# Patient Record
Sex: Male | Born: 1975 | Race: White | Hispanic: No | Marital: Married | Smoking: Never smoker
Health system: Southern US, Community
[De-identification: ages and names within clinical notes are randomized; demographics above are authoritative.]

## PROBLEM LIST (undated history)

## (undated) DIAGNOSIS — M722 Plantar fascial fibromatosis: Secondary | ICD-10-CM

## (undated) DIAGNOSIS — R51 Headache: Secondary | ICD-10-CM

## (undated) DIAGNOSIS — R519 Headache, unspecified: Secondary | ICD-10-CM

## (undated) DIAGNOSIS — K219 Gastro-esophageal reflux disease without esophagitis: Secondary | ICD-10-CM

## (undated) DIAGNOSIS — I10 Essential (primary) hypertension: Secondary | ICD-10-CM

## (undated) HISTORY — DX: Gastro-esophageal reflux disease without esophagitis: K21.9

## (undated) HISTORY — DX: Headache: R51

## (undated) HISTORY — DX: Plantar fascial fibromatosis: M72.2

## (undated) HISTORY — PX: CHOLECYSTECTOMY: SHX55

## (undated) HISTORY — DX: Essential (primary) hypertension: I10

## (undated) HISTORY — DX: Headache, unspecified: R51.9

---

## 2007-07-27 ENCOUNTER — Emergency Department: Payer: Self-pay | Admitting: Emergency Medicine

## 2010-08-19 ENCOUNTER — Inpatient Hospital Stay: Payer: Self-pay | Admitting: Surgery

## 2010-10-14 ENCOUNTER — Ambulatory Visit: Payer: Self-pay | Admitting: Gastroenterology

## 2012-03-27 ENCOUNTER — Emergency Department: Payer: Self-pay | Admitting: Emergency Medicine

## 2014-10-27 ENCOUNTER — Other Ambulatory Visit: Payer: Self-pay | Admitting: Family Medicine

## 2014-10-31 NOTE — Telephone Encounter (Signed)
Omeprazole refilled #30 (0) refill Appt needed.

## 2014-11-29 ENCOUNTER — Other Ambulatory Visit: Payer: Self-pay | Admitting: Family Medicine

## 2014-11-29 NOTE — Telephone Encounter (Signed)
Pt. Need a refill on  Omeprazole   [facility] Pt call back # is  [phone]

## 2014-11-29 NOTE — Telephone Encounter (Signed)
Patient aware of decline until appt.

## 2014-12-03 ENCOUNTER — Ambulatory Visit (INDEPENDENT_AMBULATORY_CARE_PROVIDER_SITE_OTHER): Payer: 59 | Admitting: Family Medicine

## 2014-12-03 ENCOUNTER — Telehealth: Payer: Self-pay

## 2014-12-03 ENCOUNTER — Encounter: Payer: Self-pay | Admitting: Family Medicine

## 2014-12-03 VITALS — BP 136/91 | HR 58 | Temp 98.3°F | Resp 16 | Ht 70.0 in | Wt 268.4 lb

## 2014-12-03 DIAGNOSIS — Z8249 Family history of ischemic heart disease and other diseases of the circulatory system: Secondary | ICD-10-CM | POA: Diagnosis not present

## 2014-12-03 DIAGNOSIS — R030 Elevated blood-pressure reading, without diagnosis of hypertension: Secondary | ICD-10-CM | POA: Diagnosis not present

## 2014-12-03 DIAGNOSIS — R739 Hyperglycemia, unspecified: Secondary | ICD-10-CM | POA: Diagnosis not present

## 2014-12-03 DIAGNOSIS — K219 Gastro-esophageal reflux disease without esophagitis: Secondary | ICD-10-CM | POA: Diagnosis not present

## 2014-12-03 DIAGNOSIS — IMO0001 Reserved for inherently not codable concepts without codable children: Secondary | ICD-10-CM

## 2014-12-03 DIAGNOSIS — Z833 Family history of diabetes mellitus: Secondary | ICD-10-CM | POA: Diagnosis not present

## 2014-12-03 DIAGNOSIS — I10 Essential (primary) hypertension: Secondary | ICD-10-CM | POA: Insufficient documentation

## 2014-12-03 DIAGNOSIS — E669 Obesity, unspecified: Secondary | ICD-10-CM | POA: Diagnosis not present

## 2014-12-03 MED ORDER — RANITIDINE HCL 150 MG PO CAPS: 150.0000 mg | ORAL_CAPSULE | Freq: Every day | ORAL | Status: DC

## 2014-12-03 NOTE — Progress Notes (Signed)
Date:  12/03/2014   Name:  Tyler StarcherBryan Marsch   DOB:  February 13, 1976   MRN:  [id number]  PCP:  Filbert BertholdAmy L Krebs, NP    Chief Complaint: Heartburn   History of Present Illness:  This is a 39 y.o. male for refill omprazole, has taken for years, gets burning in throat when lies down at night when doesn't take, has tried antacids which help a little, no hx EGD. BP borderline last visit, father with CABG in his early 7660's. Weight stable, no regular exercise. BG elevated in January, GM with DM, no lipids in system.  Review of Systems:  Review of Systems  Constitutional: Negative for fever and fatigue.  Respiratory: Negative for shortness of breath.   Cardiovascular: Negative for chest pain and leg swelling.  Endocrine: Negative for polyuria.  Genitourinary: Negative for difficulty urinating.    Patient Active Problem List   Diagnosis Date Noted  . Elevated BP 12/03/2014  . GERD (gastroesophageal reflux disease) 12/03/2014  . Obesity, Class II, BMI 35-39.9 12/03/2014  . Hyperglycemia 12/03/2014    Prior to Admission medications   Medication Sig Start Date End Date Taking? Authorizing Provider  ranitidine (ZANTAC) 150 MG capsule Take 1 capsule (150 mg total) by mouth at bedtime. 12/03/14   Schuyler AmorWilliam Wing Schoch, MD    Allergies  Allergen Reactions  . Aspirin     Past Surgical History  Procedure Laterality Date  . Cholecystectomy      Social History  Substance Use Topics  . Smoking status: Never Smoker   . Smokeless tobacco: Not on file  . Alcohol Use: No     Comment: former drinker    Family History  Problem Relation Age of Onset  . Cancer Maternal Grandmother   . Diabetes Maternal Grandmother   . Cancer Maternal Grandfather   . Diabetes Maternal Grandfather     Medication list has been reviewed and updated.  Physical Examination: BP 136/91 mmHg  Pulse 58  Temp(Src) 98.3 F (36.8 C) (Oral)  Resp 16  Ht 5\' 10"  (1.778 m)  Wt 268 lb 6.4 oz (121.745 kg)  BMI 38.51 kg/m2  Physical  Exam  Constitutional: He appears well-developed and well-nourished.  Cardiovascular: Normal rate, regular rhythm and normal heart sounds.   Pulmonary/Chest: Effort normal and breath sounds normal.  Musculoskeletal: He exhibits no edema.  Neurological: He is alert. Coordination normal.  Skin: Skin is warm and dry.  Psychiatric: He has a normal mood and affect. His behavior is normal.    Assessment and Plan:  1. Gastroesophageal reflux disease, esophagitis presence not specified Discussed potential LT se's PPI use, will try change to Zantac qhs, call if sxs recur, consider EGD - ranitidine (ZANTAC) 150 MG capsule; Take 1 capsule (150 mg total) by mouth at bedtime.  Dispense: 90 capsule; Refill: 3  2. Elevated BP Borderline for two visits, will decrease salt, try to lose weight, recheck 1 month  3. Obesity, Class II, BMI 35-39.9 Exercise/diet discussed, check lipids given FH heart dz - Lipid panel - TSH  4. Hyperglycemia With FH DM - HgB A1c  Return in about 4 weeks (around 12/31/2014).  Dionne AnoWilliam M. Kingsley SpittlePlonk, Jr. MD [facility]  12/03/2014

## 2014-12-03 NOTE — Telephone Encounter (Signed)
Pt's Rx for Ranitidine is 51dollar co pay but if we approve for tablet instead of capsule it will be free and got approval from Dr. Hollace HaywardPlonk.

## 2015-01-07 ENCOUNTER — Ambulatory Visit: Payer: Self-pay | Admitting: Family Medicine

## 2015-12-22 ENCOUNTER — Emergency Department
Admission: EM | Admit: 2015-12-22 | Discharge: 2015-12-22 | Disposition: A | Discharge status: Home / Self Care | Payer: 59 | Attending: Emergency Medicine | Admitting: Emergency Medicine

## 2015-12-22 ENCOUNTER — Encounter: Payer: Self-pay | Admitting: *Deleted

## 2015-12-22 ENCOUNTER — Emergency Department: Payer: 59

## 2015-12-22 DIAGNOSIS — Y999 Unspecified external cause status: Secondary | ICD-10-CM | POA: Diagnosis not present

## 2015-12-22 DIAGNOSIS — Y929 Unspecified place or not applicable: Secondary | ICD-10-CM | POA: Insufficient documentation

## 2015-12-22 DIAGNOSIS — S6991XA Unspecified injury of right wrist, hand and finger(s), initial encounter: Secondary | ICD-10-CM | POA: Diagnosis present

## 2015-12-22 DIAGNOSIS — Y9389 Activity, other specified: Secondary | ICD-10-CM | POA: Diagnosis not present

## 2015-12-22 DIAGNOSIS — I10 Essential (primary) hypertension: Secondary | ICD-10-CM | POA: Insufficient documentation

## 2015-12-22 DIAGNOSIS — W2201XA Walked into wall, initial encounter: Secondary | ICD-10-CM | POA: Diagnosis not present

## 2015-12-22 DIAGNOSIS — S62316A Displaced fracture of base of fifth metacarpal bone, right hand, initial encounter for closed fracture: Secondary | ICD-10-CM | POA: Diagnosis not present

## 2015-12-22 DIAGNOSIS — S62339A Displaced fracture of neck of unspecified metacarpal bone, initial encounter for closed fracture: Secondary | ICD-10-CM

## 2015-12-22 MED ORDER — IBUPROFEN 800 MG PO TABS
800.0000 mg | ORAL_TABLET | Freq: Once | ORAL | Status: AC
  Administered 2015-12-22: 800 mg via ORAL
  Filled 2015-12-22: qty 1

## 2015-12-22 MED ORDER — OXYCODONE-ACETAMINOPHEN 5-325 MG PO TABS: 1.0000 | ORAL_TABLET | Freq: Four times a day (QID) | ORAL | 0 refills | Status: DC | PRN

## 2015-12-22 MED ORDER — IBUPROFEN 800 MG PO TABS: 800.0000 mg | ORAL_TABLET | Freq: Three times a day (TID) | ORAL | 0 refills | Status: DC | PRN

## 2015-12-22 MED ORDER — OXYCODONE-ACETAMINOPHEN 5-325 MG PO TABS
1.0000 | ORAL_TABLET | Freq: Once | ORAL | Status: AC
  Administered 2015-12-22: 1 via ORAL
  Filled 2015-12-22: qty 1

## 2015-12-22 NOTE — ED Provider Notes (Signed)
[facility] Emergency Department Provider Note   ____________________________________________   None    (approximate)  I have reviewed the triage vital signs and the nursing notes.   HISTORY  Chief Complaint Hand Injury    HPI Tyler StarcherBryan Hinton is a 40 y.o. male patient complain of pain and edema to the right hand secondary to punching a wall last night. Patient rates his pain as a 4/10. Patient described a pain as "achy". Patient denies loss of sensation or function of the hand. No palliative measures for this complaint.   Past Medical History:  Diagnosis Date  . GERD (gastroesophageal reflux disease)   . Headache   . Hypertension   . Plantar fasciitis    in past    Patient Active Problem List   Diagnosis Date Noted  . Elevated BP 12/03/2014  . GERD (gastroesophageal reflux disease) 12/03/2014  . Obesity, Class II, BMI 35-39.9 12/03/2014  . Hyperglycemia 12/03/2014  . FH: heart disease 12/03/2014  . FH: diabetes mellitus 12/03/2014    Past Surgical History:  Procedure Laterality Date  . CHOLECYSTECTOMY      Prior to Admission medications   Medication Sig Start Date End Date Taking? Authorizing Provider  ibuprofen (ADVIL,MOTRIN) 800 MG tablet Take 1 tablet (800 mg total) by mouth every 8 (eight) hours as needed for moderate pain. 12/22/15   Joni Reiningonald K Kiril Hippe, PA-C  oxyCODONE-acetaminophen (ROXICET) 5-325 MG tablet Take 1 tablet by mouth every 6 (six) hours as needed. 12/22/15 12/21/16  Joni Reiningonald K Leander Tout, PA-C  ranitidine (ZANTAC) 150 MG capsule Take 1 capsule (150 mg total) by mouth at bedtime. 12/03/14   Schuyler AmorWilliam Plonk, MD    Allergies Aspirin  Family History  Problem Relation Age of Onset  . Cancer Maternal Grandmother   . Diabetes Maternal Grandmother   . Cancer Maternal Grandfather   . Diabetes Maternal Grandfather     Social History Social History  Substance Use Topics  . Smoking status: Never Smoker  . Smokeless tobacco: Not on file   . Alcohol use No     Comment: former drinker    Review of Systems Constitutional: No fever/chills Eyes: No visual changes. ENT: No sore throat. Cardiovascular: Denies chest pain. Respiratory: Denies shortness of breath. Gastrointestinal: No abdominal pain.  No nausea, no vomiting.  No diarrhea.  No constipation. Genitourinary: Negative for dysuria. Musculoskeletal: Right hand pain. Skin: Negative for rash. Neurological: Negative for headaches, focal weakness or numbness. Endocrine:Hypertension Hematological/Lymphatic: Allergic/Immunilogical: Aspirin   ____________________________________________   PHYSICAL EXAM:  VITAL SIGNS: ED Triage Vitals  Enc Vitals Group     BP 12/22/15 1248 (!) 130/95     Pulse Rate 12/22/15 1248 96     Resp 12/22/15 1248 20     Temp 12/22/15 1248 97.6 F (36.4 C)     Temp Source 12/22/15 1248 Oral     SpO2 12/22/15 1248 98 %     Weight 12/22/15 1241 270 lb (122.5 kg)     Height 12/22/15 1241 5\' 10"  (1.778 m)     Head Circumference --      Peak Flow --      Pain Score 12/22/15 1242 2     Pain Loc --      Pain Edu? --      Excl. in GC? --     Constitutional: Alert and oriented. Well appearing and in no acute distress. Eyes: Conjunctivae are normal. PERRL. EOMI. Head: Atraumatic. Nose: No congestion/rhinnorhea. Mouth/Throat: Mucous membranes are moist.  Oropharynx non-erythematous. Neck: No stridor.  No cervical spine tenderness to palpation. Hematological/Lymphatic/Immunilogical: No cervical lymphadenopathy. Cardiovascular: Normal rate, regular rhythm. Grossly normal heart sounds.  Good peripheral circulation. Respiratory: Normal respiratory effort.  No retractions. Lungs CTAB. Gastrointestinal: Soft and nontender. No distention. No abdominal bruits. No CVA tenderness. Musculoskeletal: No lower extremity tenderness nor edema.  No joint effusions. Neurologic:  Normal speech and language. No gross focal neurologic deficits are  appreciated. No gait instability. Skin:  Skin is warm, dry and intact. No rash noted. Psychiatric: Mood and affect are normal. Speech and behavior are normal.  ____________________________________________   LABS (all labs ordered are listed, but only abnormal results are displayed)  Labs Reviewed - No data to display ____________________________________________  EKG   ____________________________________________  RADIOLOGY  X-rays reveal fracture base of fifth right metacarpal carpal. ____________________________________________   PROCEDURES  Procedure(s) performed: None  Procedures  Critical Care performed: No  ____________________________________________   INITIAL IMPRESSION / ASSESSMENT AND PLAN / ED COURSE  Pertinent labs & imaging results that were available during my care of the patient were reviewed by me and considered in my medical decision making (see chart for details).  Boxer's fracture right hand. Discussed x-ray finding with patient. Patient given discharge care instructions. Patient follow orthopedics for definitive treatment.  Clinical Course   Patient given Percocet and ibuprofen and placed inan ulnar gutter splint. Patient advised to follow orthopedics telephonically tomorrow morning.   ____________________________________________   FINAL CLINICAL IMPRESSION(S) / ED DIAGNOSES  Final diagnoses:  Closed boxer's fracture, initial encounter      NEW MEDICATIONS STARTED DURING THIS VISIT:  New Prescriptions   IBUPROFEN (ADVIL,MOTRIN) 800 MG TABLET    Take 1 tablet (800 mg total) by mouth every 8 (eight) hours as needed for moderate pain.   OXYCODONE-ACETAMINOPHEN (ROXICET) 5-325 MG TABLET    Take 1 tablet by mouth every 6 (six) hours as needed.     Note:  This document was prepared using Dragon voice recognition software and may include unintentional dictation errors.    Joni ReiningRonald K Sammantha Mehlhaff, PA-C 12/22/15 1505    Myrna Blazeravid Matthew Schaevitz,  MD 12/22/15 [phone]

## 2015-12-22 NOTE — ED Triage Notes (Signed)
Pt punched right hand against wall last night, hand is swollen and painful

## 2015-12-22 NOTE — ED Notes (Signed)
PA notified about BP  

## 2015-12-26 ENCOUNTER — Other Ambulatory Visit: Payer: Self-pay | Admitting: Orthopaedic Surgery

## 2015-12-26 DIAGNOSIS — S62316A Displaced fracture of base of fifth metacarpal bone, right hand, initial encounter for closed fracture: Secondary | ICD-10-CM

## 2016-01-07 ENCOUNTER — Ambulatory Visit
Admission: RE | Admit: 2016-01-07 | Discharge: 2016-01-07 | Disposition: A | Discharge status: Home / Self Care | Payer: 59 | Source: Ambulatory Visit | Attending: Orthopaedic Surgery | Admitting: Orthopaedic Surgery

## 2016-01-07 ENCOUNTER — Ambulatory Visit: Payer: 59

## 2016-01-07 DIAGNOSIS — X58XXXA Exposure to other specified factors, initial encounter: Secondary | ICD-10-CM | POA: Diagnosis not present

## 2016-01-07 DIAGNOSIS — S62316A Displaced fracture of base of fifth metacarpal bone, right hand, initial encounter for closed fracture: Secondary | ICD-10-CM | POA: Diagnosis not present

## 2016-10-29 ENCOUNTER — Encounter: Payer: Self-pay | Admitting: Nurse Practitioner

## 2016-10-29 ENCOUNTER — Ambulatory Visit (INDEPENDENT_AMBULATORY_CARE_PROVIDER_SITE_OTHER): Payer: 59 | Admitting: Nurse Practitioner

## 2016-10-29 VITALS — BP 132/87 | HR 69 | Temp 98.3°F | Ht 70.5 in | Wt 277.0 lb

## 2016-10-29 DIAGNOSIS — I10 Essential (primary) hypertension: Secondary | ICD-10-CM | POA: Diagnosis not present

## 2016-10-29 DIAGNOSIS — E669 Obesity, unspecified: Secondary | ICD-10-CM | POA: Diagnosis not present

## 2016-10-29 MED ORDER — LISINOPRIL 10 MG PO TABS: 10.0000 mg | ORAL_TABLET | Freq: Every day | ORAL | 1 refills | Status: DC

## 2016-10-29 NOTE — Patient Instructions (Addendum)
Tyler Hinton, Thank you for coming in to clinic today.  1. For your blood pressure: - GOAL: less than 130/80 and higher than 90/60.  If less than 105/65, call clinic for med change.  2.  We do need labs.  SO please stop by a [facility] location Friday or next Monday.   Please schedule a follow-up appointment with Tyler Hinton, Tyler Hinton. Return in about 4 weeks (around 11/26/2016) for Blood Pressure.  If you have any other questions or concerns, please feel free to call the clinic or send a message through MyChart. You may also schedule an earlier appointment if necessary.  You will receive a survey after today's visit either digitally by e-mail or paper by [employer] mail. Your experiences and feedback matter to us.  Please respond so we know how we are doing as we provide care for you.   Tyler McardleLauren Obelia Bonello, DNP, Tyler Hinton-BC Adult Gerontology Nurse Practitioner [facility], [facility]    Hypertension Hypertension, commonly called high blood pressure, is when the force of blood pumping through the arteries is too strong. The arteries are the blood vessels that carry blood from the heart throughout the body. Hypertension forces the heart to work harder to pump blood and may cause arteries to become narrow or stiff. Having untreated or uncontrolled hypertension can cause heart attacks, strokes, kidney disease, and other problems. A blood pressure reading consists of a higher number over a lower number. Ideally, your blood pressure should be below 120/80. The first ("top") number is called the systolic pressure. It is a measure of the pressure in your arteries as your heart beats. The second ("bottom") number is called the diastolic pressure. It is a measure of the pressure in your arteries as the heart relaxes. What are the causes? The cause of this condition is not known. What increases the risk? Some risk factors for high blood pressure are under your control. Others are not. Factors you can  change  Smoking.  Having type 2 diabetes mellitus, high cholesterol, or both.  Not getting enough exercise or physical activity.  Being overweight.  Having too much fat, sugar, calories, or salt (sodium) in your diet.  Drinking too much alcohol. Factors that are difficult or impossible to change  Having chronic kidney disease.  Having a family history of high blood pressure.  Age. Risk increases with age.  Race. You may be at higher risk if you are African-American.  Gender. Men are at higher risk than women before age 42. After age 42, women are at higher risk than men.  Having obstructive sleep apnea.  Stress. What are the signs or symptoms? Extremely high blood pressure (hypertensive crisis) may cause:  Headache.  Anxiety.  Shortness of breath.  Nosebleed.  Nausea and vomiting.  Severe chest pain.  Jerky movements you cannot control (seizures).  How is this diagnosed? This condition is diagnosed by measuring your blood pressure while you are seated, with your arm resting on a surface. The cuff of the blood pressure monitor will be placed directly against the skin of your upper arm at the level of your heart. It should be measured at least twice using the same arm. Certain conditions can cause a difference in blood pressure between your right and left arms. Certain factors can cause blood pressure readings to be lower or higher than normal (elevated) for a short period of time:  When your blood pressure is higher when you are in a health care provider's office than when you are  at home, this is called white coat hypertension. Most people with this condition do not need medicines.  When your blood pressure is higher at home than when you are in a health care provider's office, this is called masked hypertension. Most people with this condition may need medicines to control blood pressure.  If you have a high blood pressure reading during one visit or you have  normal blood pressure with other risk factors:  You may be asked to return on a different day to have your blood pressure checked again.  You may be asked to monitor your blood pressure at home for 1 week or longer.  If you are diagnosed with hypertension, you may have other blood or imaging tests to help your health care provider understand your overall risk for other conditions. How is this treated? This condition is treated by making healthy lifestyle changes, such as eating healthy foods, exercising more, and reducing your alcohol intake. Your health care provider may prescribe medicine if lifestyle changes are not enough to get your blood pressure under control, and if:  Your systolic blood pressure is above 130.  Your diastolic blood pressure is above 80.  Your personal target blood pressure may vary depending on your medical conditions, your age, and other factors. Follow these instructions at home: DASH Eating plan: Eating and drinking  Eat a diet that is high in fiber and potassium, and low in sodium, added sugar, and fat. An example eating plan is called the DASH (Dietary Approaches to Stop Hypertension) diet. To eat this way: ? Eat plenty of fresh fruits and vegetables. Try to fill half of your plate at each meal with fruits and vegetables. ? Eat whole grains, such as whole wheat pasta, brown rice, or whole grain bread. Fill about one quarter of your plate with whole grains. ? Eat or drink low-fat dairy products, such as skim milk or low-fat yogurt. ? Avoid fatty cuts of meat, processed or cured meats, and poultry with skin. Fill about one quarter of your plate with lean proteins, such as fish, chicken without skin, beans, eggs, and tofu. ? Avoid premade and processed foods. These tend to be higher in sodium, added sugar, and fat.  Reduce your daily sodium intake. Most people with hypertension should eat less than 1,500 mg of sodium a day.  Limit alcohol intake to no more than 1  drink a day for nonpregnant women and 2 drinks a day for men. One drink equals 12 oz of beer, 5 oz of wine, or 1 oz of hard liquor. Lifestyle  Work with your health care provider to maintain a healthy body weight or to lose weight. Ask what an ideal weight is for you.  Get at least 30 minutes of exercise that causes your heart to beat faster (aerobic exercise) most days of the week. Activities may include walking, swimming, or biking.  Include exercise to strengthen your muscles (resistance exercise), such as pilates or lifting weights, as part of your weekly exercise routine. Try to do these types of exercises for 30 minutes at least 3 days a week.  Do not use any products that contain nicotine or tobacco, such as cigarettes and e-cigarettes. If you need help quitting, ask your health care provider.  Monitor your blood pressure at home as told by your health care provider.  Keep all follow-up visits as told by your health care provider. This is important. Medicines  Take over-the-counter and prescription medicines only as told by your  health care provider. Follow directions carefully. Blood pressure medicines must be taken as prescribed.  Do not skip doses of blood pressure medicine. Doing this puts you at risk for problems and can make the medicine less effective.  Ask your health care provider about side effects or reactions to medicines that you should watch for. Contact a health care provider if:  You think you are having a reaction to a medicine you are taking.  You have headaches that keep coming back (recurring).  You feel dizzy.  You have swelling in your ankles.  You have trouble with your vision. Get help right away if:  You develop a severe headache or confusion.  You have unusual weakness or numbness.  You feel faint.  You have severe pain in your chest or abdomen.  You vomit repeatedly.  You have trouble breathing. Summary  Hypertension is when the force  of blood pumping through your arteries is too strong. If this condition is not controlled, it may put you at risk for serious complications.  Your personal target blood pressure may vary depending on your medical conditions, your age, and other factors. For most people, a normal blood pressure is less than 120/80.  Hypertension is treated with lifestyle changes, medicines, or a combination of both. Lifestyle changes include weight loss, eating a healthy, low-sodium diet, exercising more, and limiting alcohol. This information is not intended to replace advice given to you by your health care provider. Make sure you discuss any questions you have with your health care provider. Document Released: 02/02/2005 Document Revised: 01/01/2016 Document Reviewed: 01/01/2016 Elsevier Interactive Patient Education  [employer].

## 2016-10-29 NOTE — Progress Notes (Signed)
Subjective:    Patient ID: Tyler Hinton, male    DOB: 25-Aug-1975, 41 y.o.   MRN: [id number]  Tyler StarcherBryan Mayabb is a 41 y.o. male presenting on 10/29/2016 for Hypertension (pt had a health screening at work yesterday bp, 162/108, 136/110)   HPI Hypertension Has had health screening w/ in itial BP reading of 162/108 and recheck of 136/110. Has had headaches in past, but no changes recently. - He is not checking BP at home or outside of clinic.    Was recently given a BP cuff for home monitoring. - Current medications: none - He is not currently symptomatic. - Pt denies lightheadedness, dizziness, changes in vision, chest tightness/pressure, palpitations, leg swelling, sudden loss of speech or loss of consciousness. - He  reports no regular exercise routine.  Manufacturing, standing for job. First shift. - His diet is moderate in salt (does not add to food), high in fat, and high in carbohydrates.  Frequent fried foods.  Prepared by mother (adds some salt when cooks).  Meals out at restaurants - Dinner out 2-3 times per week (burger and fries at least 1x).  Sodas: mountain dew, coke - has had 1 20 oz mt dew.  Daily usually 3-4 tea glasses. Then out to eat 36-48 oz.   POCT at job on 10/28/16 CHol: 189 HDL: 39 Trig: 86 Calc LDL: 132 LDL/HDL: 3.4 Glu: 86   Social History  Substance Use Topics  . Smoking status: Never Smoker  . Smokeless tobacco: Never Used  . Alcohol use No     Comment: former drinker    Review of Systems Per HPI unless specifically indicated above     Objective:    BP 132/87   Pulse 69   Temp 98.3 F (36.8 C) (Oral)   Ht 5' 10.5" (1.791 m)   Wt 277 lb (125.6 kg)   BMI 39.18 kg/m   Wt Readings from Last 3 Encounters:  10/29/16 277 lb (125.6 kg)  12/22/15 270 lb (122.5 kg)  12/03/14 268 lb 6.4 oz (121.7 kg)    Physical Exam  General - obese, well-appearing, NAD HEENT - Normocephalic, atraumatic Neck - supple, non-tender, no LAD, no thyromegaly, no  carotid bruit Heart - RRR, no murmurs heard Lungs - Clear throughout all lobes, no wheezing, crackles, or rhonchi. Normal work of breathing. Extremeties - non-tender, no edema, cap refill < 2 seconds, peripheral pulses intact +2 bilaterally Skin - warm, dry Neuro - awake, alert, oriented x3, normal gait Psych - Normal mood and affect, normal behavior       Assessment & Plan:   Problem List Items Addressed This Visit      Cardiovascular and Mediastinum   Essential hypertension - Primary    Currently not at goal of < 130/80.  At work screening w/ BP 162/108 and recheck of 136/110.  Pt has had employer biometric screening and now requests treatment to achieve lower insurance premiums.  Plan: 1. Discussed short term and long term goals of treatment.  Reviewed s/sx CVA and hypertensive crisis. 2. BP only slightly elevated but should be treated to prevent long-term complications. - START lisinopril 10 mg once daily. Reviewed s/sx of treatment including cough, angioedema. - Check CMP today, BMP at next visit. Orders placed for [facility] collection. 3. Reviewed DASH eating plan and physical activity. 4. Follow up 4 weeks.      Relevant Medications   lisinopril (PRINIVIL,ZESTRIL) 10 MG tablet   Other Relevant Orders   Comprehensive metabolic panel  Other   Obesity, Class II, BMI 35-39.9    Obese w/ worsening from BMI in last 2 years. Has had gain of 9 lbs in 2 years.  Pt has dietary indiscretions.  Plan: 1. Encouraged DASH diet and smaller portions as part of HTN management. 2. Increase physical activity to 30 minutes most days of the week. 3. Follow up 4 weeks.         Meds ordered this encounter  Medications  . ibuprofen (ADVIL,MOTRIN) 200 MG tablet    Sig: Take 200 mg by mouth every 6 (six) hours as needed.  Marland Kitchen lisinopril (PRINIVIL,ZESTRIL) 10 MG tablet    Sig: Take 1 tablet (10 mg total) by mouth daily.    Dispense:  90 tablet    Refill:  1      Follow up  plan: Return in about 4 weeks (around 11/26/2016) for Blood Pressure.   [name], DNP, AGPCNP-BC Adult Gerontology Primary Care Nurse Practitioner [facility] 10/29/2016, 4:18 PM

## 2016-11-05 NOTE — Assessment & Plan Note (Addendum)
Obese w/ worsening from BMI in last 2 years. Has had gain of 9 lbs in 2 years.  Pt has dietary indiscretions.  Plan: 1. Encouraged DASH diet and smaller portions as part of HTN management. 2. Increase physical activity to 30 minutes most days of the week. 3. Follow up 4 weeks.

## 2016-11-05 NOTE — Assessment & Plan Note (Addendum)
Currently not at goal of < 130/80.  At work screening w/ BP 162/108 and recheck of 136/110.  Pt has had employer biometric screening and now requests treatment to achieve lower insurance premiums.  Plan: 1. Discussed short term and long term goals of treatment.  Reviewed s/sx CVA and hypertensive crisis. 2. BP only slightly elevated but should be treated to prevent long-term complications. - START lisinopril 10 mg once daily. Reviewed s/sx of treatment including cough, angioedema. - Check CMP today, BMP at next visit. Orders placed for [facility] collection. 3. Reviewed DASH eating plan and physical activity. 4. Follow up 4 weeks.

## 2016-11-27 ENCOUNTER — Ambulatory Visit: Payer: 59 | Admitting: Nurse Practitioner

## 2016-12-03 ENCOUNTER — Encounter: Payer: Self-pay | Admitting: Nurse Practitioner

## 2016-12-03 ENCOUNTER — Ambulatory Visit (INDEPENDENT_AMBULATORY_CARE_PROVIDER_SITE_OTHER): Payer: 59 | Admitting: Nurse Practitioner

## 2016-12-03 VITALS — BP 134/86 | HR 58 | Temp 97.9°F | Ht 70.5 in | Wt 275.2 lb

## 2016-12-03 DIAGNOSIS — I10 Essential (primary) hypertension: Secondary | ICD-10-CM | POA: Diagnosis not present

## 2016-12-03 DIAGNOSIS — H6981 Other specified disorders of Eustachian tube, right ear: Secondary | ICD-10-CM | POA: Diagnosis not present

## 2016-12-03 DIAGNOSIS — Z6838 Body mass index (BMI) 38.0-38.9, adult: Secondary | ICD-10-CM | POA: Diagnosis not present

## 2016-12-03 DIAGNOSIS — Z131 Encounter for screening for diabetes mellitus: Secondary | ICD-10-CM

## 2016-12-03 DIAGNOSIS — Z13220 Encounter for screening for lipoid disorders: Secondary | ICD-10-CM | POA: Diagnosis not present

## 2016-12-03 MED ORDER — LISINOPRIL 20 MG PO TABS
20.0000 mg | ORAL_TABLET | Freq: Every day | ORAL | 5 refills | Status: DC
Start: 2016-12-03 — End: 2017-03-05

## 2016-12-03 MED ORDER — FLUTICASONE PROPIONATE 50 MCG/ACT NA SUSP: 1.0000 | Freq: Every day | NASAL | 6 refills | Status: DC

## 2016-12-03 MED ORDER — CETIRIZINE HCL 10 MG PO TABS: 10.0000 mg | ORAL_TABLET | Freq: Every day | ORAL | 3 refills | Status: DC

## 2016-12-03 NOTE — Progress Notes (Signed)
Subjective:    Patient ID: Tyler Hinton, male    DOB: [birth year], 41 y.o.   MRN: [id number]  Tyler Hinton is a 41 y.o. male presenting on 12/03/2016 for Hypertension and Tinnitus (crackling sound in the right ear x 1 mth)   HPI Hypertension - He is checking BP at home or outside of clinic.  Readings 130-140/80-90; last night 133/90 - Current medications: lisinopril 10 mg once daily, tolerating well without side effects - He is not currently symptomatic. - Pt denies headache, lightheadedness, dizziness, changes in vision, chest tightness/pressure, palpitations, leg swelling, sudden loss of speech or loss of consciousness. - He  reports no regular exercise routine. - His diet is moderate in salt, moderate in fat, and moderate in carbohydrates.  Ear Changes: Crackling Sound in right ear x 1 month Pt notes crackling sound w/ pitch has pop and crackles w/ volume/pitches.  Has had recent nasal congestion, but has had symptoms even w/o congestion. Can pop ears/yawn/cough and resolve problem w/ clearer sounds.  Has had frequent exposure to loud machinery since 15.  Laying brick Warehouse manager(mortar mixer, bobcats, tractors, chipper/chainsaws).  Never ear protection.  Now works in [employer] w/ [employer] machines.  Does not have any change or decrease in hearing.  Occasionally has people ask to turn down TV volume.  Social History  Substance Use Topics  . Smoking status: Never Smoker  . Smokeless tobacco: Never Used  . Alcohol use No     Comment: former drinker    Review of Systems Per HPI unless specifically indicated above     Objective:    BP 134/86 (BP Location: Right Arm, Patient Position: Sitting, Cuff Size: Large)   Pulse (!) 58   Temp 97.9 F (36.6 C) (Oral)   Ht 5' 10.5" (1.791 m)   Wt 275 lb 3.2 oz (124.8 kg)   BMI 38.93 kg/m   Wt Readings from Last 3 Encounters:  12/03/16 275 lb 3.2 oz (124.8 kg)  10/29/16 277 lb (125.6 kg)  12/22/15 270 lb (122.5 kg)    Physical Exam  General -  healthy, well-appearing, NAD HEENT - Normocephalic, atraumatic, PERRL, EOMI, patent nares w/o congestion, oropharynx clear, MMM, TM normal, Ear canal normal, normal cerumen pattern, external ear normal w/o lesions Neck - supple, non-tender, no LAD, no thyromegaly Heart - RRR, no murmurs heard Lungs - Clear throughout all lobes, no wheezing, crackles, or rhonchi. Normal work of breathing. Extremeties - non-tender, no edema, cap refill < 2 seconds, peripheral pulses intact +2 bilaterally Skin - warm, dry Neuro - awake, alert, oriented x3, normal gait Psych - Normal mood and affect, normal behavior    Results for orders placed or performed in visit on 12/03/16  Comprehensive metabolic panel  Result Value Ref Range   Glucose, Bld 88 65 - 99 mg/dL   BUN 12 7 - 25 mg/dL   Creat 4.090.97 8.110.60 - 9.141.35 mg/dL   BUN/Creatinine Ratio NOT APPLICABLE 6 - 22 (calc)   Sodium 142 135 - 146 mmol/L   Potassium 4.7 3.5 - 5.3 mmol/L   Chloride 105 98 - 110 mmol/L   CO2 28 20 - 32 mmol/L   Calcium 9.5 8.6 - 10.3 mg/dL   Total Protein 7.2 6.1 - 8.1 g/dL   Albumin 4.4 3.6 - 5.1 g/dL   Globulin 2.8 1.9 - 3.7 g/dL (calc)   AG Ratio 1.6 1.0 - 2.5 (calc)   Total Bilirubin 0.6 0.2 - 1.2 mg/dL   Alkaline phosphatase (APISO) 62 40 -  115 U/L   AST 24 10 - 40 U/L   ALT 41 9 - 46 U/L  Lipid panel  Result Value Ref Range   Cholesterol 181 <200 mg/dL   HDL 41 >40 mg/dL   Triglycerides 84 <981 mg/dL   LDL Cholesterol (Calc) 122 (H) mg/dL (calc)   Total CHOL/HDL Ratio 4.4 <5.0 (calc)   Non-HDL Cholesterol (Calc) 140 (H) <130 mg/dL (calc)  HgB X9J  Result Value Ref Range   Hgb A1c MFr Bld 5.4 <5.7 % of total Hgb   Mean Plasma Glucose 108 (calc)   eAG (mmol/L) 6.0 (calc)  TSH  Result Value Ref Range   TSH 1.53 0.40 - 4.50 mIU/L      Assessment & Plan:   Problem List Items Addressed This Visit      Cardiovascular and Mediastinum   Essential hypertension - Primary    Remains above goal of < 130/80, but is  significantly improved.  Pt is taking lisinopril and tolerating well w/o side effects.  No complications at this time.  Plan: 1. INCREASE lisinopril to 20 mg once daily. 2. No labs after last visit.  Collect CMP today. 3. Reviewed DASH eating plan and physical activity. 4. Follow up 3 months or sooner if needed.      Relevant Medications   lisinopril (PRINIVIL,ZESTRIL) 20 MG tablet   Other Relevant Orders   Comprehensive metabolic panel (Completed)    Other Visit Diagnoses    Screening for diabetes mellitus (DM)       Pt w/o recent screening for DM.  Has comorbidities of obesity and HTN.  Plan: 1. Check Hgb A1c   Relevant Orders   HgB A1c (Completed)   Screening for hyperlipidemia       Pt w/o screening of cholesterol.  Has comorbidities of obesity and HTN.   Plan: 1. Check Lipid panel   Relevant Orders   Lipid panel (Completed)   Class 2 severe obesity due to excess calories with serious comorbidity and body mass index (BMI) of 38.0 to 38.9 in adult [facility])       Pt has calorie overconsumption w/ 2 lbs weight loss in 4 weeks.  Stable weight, but improving diet and activity.  Plan: 1. Encouraged continued lifestyle changes. 2. Screen labs for possible comorbidities. 3. Follow up in 3 months and at least once per year.   Relevant Orders   Comprehensive metabolic panel (Completed)   Lipid panel (Completed)   HgB A1c (Completed)   TSH (Completed)   Dysfunction of right eustachian tube     Subacute symptoms of popping of right ear most likely indicates eustachian tube dysfunction.  Less likely tinnitus given HPI.  Plan: 1. START fluticasone nasal spray once daily. 2. START antihistamine cetirizine once daily. 3. Consider follow up w/ ENT in future if needed.  Long history of prolonged noise exposure.  Consider audiology as well. 4. Follow up as needed.   Relevant Medications   fluticasone (FLONASE) 50 MCG/ACT nasal spray   cetirizine (ZYRTEC) 10 MG tablet      Meds  ordered this encounter  Medications  . lisinopril (PRINIVIL,ZESTRIL) 20 MG tablet    Sig: Take 1 tablet (20 mg total) by mouth daily.    Dispense:  30 tablet    Refill:  5    Order Specific Question:   Supervising Provider    Answer:   Smitty Cords [[id number]]  . fluticasone (FLONASE) 50 MCG/ACT nasal spray    Sig: Place 1 spray  into both nostrils daily.    Dispense:  16 g    Refill:  6    Order Specific Question:   Supervising Provider    Answer:   Smitty Cords [[id number]]  . cetirizine (ZYRTEC) 10 MG tablet    Sig: Take 1 tablet (10 mg total) by mouth daily.    Dispense:  90 tablet    Refill:  3    Order Specific Question:   Supervising Provider    Answer:   Smitty Cords [[id number]]      Follow up plan: Return in about 3 months (around 03/05/2017) for hypertension.  [name], DNP, AGPCNP-BC Adult Gerontology Primary Care Nurse Practitioner [facility] 12/09/2016, 7:59 PM

## 2016-12-03 NOTE — Patient Instructions (Addendum)
Tyler Hinton, Thank you for coming in to clinic today.  1. Come tomorrow morning for labs.  You do not have to be fasting.  2. For your popping/crackling in your ears, this is likely eustachian tube dysfunction.   - START using Flonase one spray in each nostril once daily for 4-6 weeks. - May also start Claritin, Allegra, or Zyrtec. - We can always consider ENT specialist in the future.    Please schedule a follow-up appointment with [name], AGNP. Return in about 3 months (around 03/05/2017) for hypertension.  If you have any other questions or concerns, please feel free to call the clinic or send a message through MyChart. You may also schedule an earlier appointment if necessary.  You will receive a survey after today's visit either digitally by e-mail or paper by [employer]. Your experiences and feedback matter to [place].  Please respond so we know how we are doing as we provide care for you.  [name], DNP, AGNP-BC Adult Gerontology Nurse Practitioner [facility], [facility]    Eustachian Tube Dysfunction The eustachian tube connects the middle ear to the back of the nose. It regulates air pressure in the middle ear by allowing air to move between the ear and nose. It also helps to drain fluid from the middle ear space. When the eustachian tube does not function properly, air pressure, fluid, or both can build up in the middle ear. Eustachian tube dysfunction can affect one or both ears. What are the causes? This condition happens when the eustachian tube becomes blocked or cannot open normally. This may result from:  Ear infections.  Colds and other upper respiratory infections.  Allergies.  Irritation, such as from cigarette smoke or acid from the stomach coming up into the esophagus (gastroesophageal reflux).  Sudden changes in air pressure, such as from descending in an airplane.  Abnormal growths in the nose or throat, such as nasal polyps, tumors, or  enlarged tissue at the back of the throat (adenoids).  What increases the risk? This condition may be more likely to develop in people who smoke and people who are overweight. Eustachian tube dysfunction may also be more likely to develop in children, especially children who have:  Certain birth defects of the mouth, such as cleft palate.  Large tonsils and adenoids.  What are the signs or symptoms? Symptoms of this condition may include:  A feeling of fullness in the ear.  Ear pain.  Clicking or popping noises in the ear.  Ringing in the ear.  Hearing loss.  Loss of balance.  Symptoms may get worse when the air pressure around you changes, such as when you travel to an area of high elevation or fly on an airplane. How is this diagnosed? This condition may be diagnosed based on:  Your symptoms.  A physical exam of your ear, nose, and throat.  Tests, such as those that measure: ? The movement of your eardrum (tympanogram). ? Your hearing (audiometry).  How is this treated? Treatment depends on the cause and severity of your condition. If your symptoms are mild, you may be able to relieve your symptoms by moving air into ("popping") your ears. If you have symptoms of fluid in your ears, treatment may include:  Decongestants.  Antihistamines.  Nasal sprays or ear drops that contain medicines that reduce swelling (steroids).  In some cases, you may need to have a procedure to drain the fluid in your eardrum (myringotomy). In this procedure, a  small tube is placed in the eardrum to:  Drain the fluid.  Restore the air in the middle ear space.  Follow these instructions at home:  Take over-the-counter and prescription medicines only as told by your health care provider.  Use techniques to help pop your ears as recommended by your health care provider. These may include: ? Chewing gum. ? Yawning. ? Frequent, forceful swallowing. ? Closing your mouth, holding your  nose closed, and gently blowing as if you are trying to blow air out of your nose.  Do not do any of the following until your health care provider approves: ? Travel to high altitudes. ? Fly in airplanes. ? Work in a Estate agentpressurized cabin or room. ? Scuba dive.  Keep your ears dry. Dry your ears completely after showering or bathing.  Do not smoke.  Keep all follow-up visits as told by your health care provider. This is important. Contact a health care provider if:  Your symptoms do not go away after treatment.  Your symptoms come back after treatment.  You are unable to pop your ears.  You have: ? A fever. ? Pain in your ear. ? Pain in your head or neck. ? Fluid draining from your ear.  Your hearing suddenly changes.  You become very dizzy.  You lose your balance. This information is not intended to replace advice given to you by your health care provider. Make sure you discuss any questions you have with your health care provider. Document Released: 03/01/2015 Document Revised: 07/11/2015 Document Reviewed: 02/21/2014 Elsevier Interactive Patient Education  [employer]2018 Elsevier Inc.

## 2016-12-04 ENCOUNTER — Other Ambulatory Visit: Payer: 59

## 2016-12-05 LAB — COMPREHENSIVE METABOLIC PANEL
AG Ratio: 1.6 (calc) (ref 1.0–2.5)
ALT: 41 U/L (ref 9–46)
AST: 24 U/L (ref 10–40)
Albumin: 4.4 g/dL (ref 3.6–5.1)
Alkaline phosphatase (APISO): 62 U/L (ref 40–115)
BUN: 12 mg/dL (ref 7–25)
CO2: 28 mmol/L (ref 20–32)
Calcium: 9.5 mg/dL (ref 8.6–10.3)
Chloride: 105 mmol/L (ref 98–110)
Creat: 0.97 mg/dL (ref 0.60–1.35)
Globulin: 2.8 g/dL (calc) (ref 1.9–3.7)
Glucose, Bld: 88 mg/dL (ref 65–99)
Potassium: 4.7 mmol/L (ref 3.5–5.3)
Sodium: 142 mmol/L (ref 135–146)
Total Bilirubin: 0.6 mg/dL (ref 0.2–1.2)
Total Protein: 7.2 g/dL (ref 6.1–8.1)

## 2016-12-05 LAB — LIPID PANEL
Cholesterol: 181 mg/dL (ref <200)
HDL: 41 mg/dL (ref >40)
LDL Cholesterol (Calc): 122 mg/dL (calc) — ABNORMAL HIGH
Non-HDL Cholesterol (Calc): 140 mg/dL (calc) — ABNORMAL HIGH (ref <130)
Total CHOL/HDL Ratio: 4.4 (calc) (ref <5.0)
Triglycerides: 84 mg/dL (ref <150)

## 2016-12-05 LAB — HEMOGLOBIN A1C
Hgb A1c MFr Bld: 5.4 % of total Hgb (ref <5.7)
Mean Plasma Glucose: 108 (calc)
eAG (mmol/L): 6 (calc)

## 2016-12-05 LAB — TSH: TSH: 1.53 mIU/L (ref 0.40–4.50)

## 2016-12-09 NOTE — Assessment & Plan Note (Signed)
Remains above goal of < 130/80, but is significantly improved.  Pt is taking lisinopril and tolerating well w/o side effects.  No complications at this time.  Plan: 1. INCREASE lisinopril to 20 mg once daily. 2. No labs after last visit.  Collect CMP today. 3. Reviewed DASH eating plan and physical activity. 4. Follow up 3 months or sooner if needed.

## 2016-12-15 ENCOUNTER — Encounter: Payer: Self-pay | Admitting: Nurse Practitioner

## 2016-12-15 ENCOUNTER — Ambulatory Visit (INDEPENDENT_AMBULATORY_CARE_PROVIDER_SITE_OTHER): Payer: 59 | Admitting: Nurse Practitioner

## 2016-12-15 VITALS — BP 115/74 | HR 77 | Temp 98.2°F | Ht 70.5 in | Wt 273.0 lb

## 2016-12-15 DIAGNOSIS — L259 Unspecified contact dermatitis, unspecified cause: Secondary | ICD-10-CM | POA: Diagnosis not present

## 2016-12-15 MED ORDER — PREDNISONE 10 MG PO TABS
ORAL_TABLET | ORAL | 0 refills | Status: DC
Start: 2016-12-15 — End: 2017-03-05

## 2016-12-15 MED ORDER — TRIAMCINOLONE ACETONIDE 0.1 % EX CREA: 1.0000 "application " | TOPICAL_CREAM | Freq: Two times a day (BID) | CUTANEOUS | 0 refills | Status: AC

## 2016-12-15 NOTE — Patient Instructions (Addendum)
Judie GrieveBryan, Thank you for coming in to clinic today.  1. This rash is most likely a contact dermatitis.  You have had contact with something you are allergic to or is irritating to your skin.  - START triamcinolone cream.  Apply twice per day for 14 days.   - START prednisone 10 mg tablets for 7 days. Day 1 (Today): Take 6 pills at one time Day 2: Take 6 pills  Day 3: Take 5 pills Day 4: Take 4 pills Day 5: Take 3 pills Day 6: Take 2 pills Day 7: Take 1 pill then stop.  Please schedule a follow-up appointment with Wilhelmina McardleLauren Blenda Wisecup, AGNP. Return 1-2 weeks if symptoms worsen or fail to improve.  If you have any other questions or concerns, please feel free to call the clinic or send a message through MyChart. You may also schedule an earlier appointment if necessary.  You will receive a survey after today's visit either digitally by e-mail or paper by [employer] mail. Your experiences and feedback matter to us.  Please respond so we know how we are doing as we provide care for you.   Wilhelmina McardleLauren Gricel Copen, DNP, AGNP-BC Adult Gerontology Nurse Practitioner [facility], [facility]   Contact Dermatitis Dermatitis is redness, soreness, and swelling (inflammation) of the skin. Contact dermatitis is a reaction to certain substances that touch the skin. There are two types of contact dermatitis:  Irritant contact dermatitis. This type is caused by something that irritates your skin, such as dry hands from washing them too much. This type does not require previous exposure to the substance for a reaction to occur. This type is more common.  Allergic contact dermatitis. This type is caused by a substance that you are allergic to, such as a nickel allergy or poison ivy. This type only occurs if you have been exposed to the substance (allergen) before. Upon a repeat exposure, your body reacts to the substance. This type is less common.  What are the causes? Many different substances can cause contact  dermatitis. Irritant contact dermatitis is most commonly caused by exposure to:  Makeup.  Soaps.  Detergents.  Bleaches.  Acids.  Metal salts, such as nickel.  Allergic contact dermatitis is most commonly caused by exposure to:  Poisonous plants.  Chemicals.  Jewelry.  Latex.  Medicines.  Preservatives in products, such as clothing.  What increases the risk? This condition is more likely to develop in:  People who have jobs that expose them to irritants or allergens.  People who have certain medical conditions, such as asthma or eczema.  What are the signs or symptoms? Symptoms of this condition may occur anywhere on your body where the irritant has touched you or is touched by you. Symptoms include:  Dryness or flaking.  Redness.  Cracks.  Itching.  Pain or a burning feeling.  Blisters.  Drainage of small amounts of blood or clear fluid from skin cracks.  With allergic contact dermatitis, there may also be swelling in areas such as the eyelids, mouth, or genitals. How is this diagnosed? This condition is diagnosed with a medical history and physical exam. A patch skin test may be performed to help determine the cause. If the condition is related to your job, you may need to see an occupational medicine specialist. How is this treated? Treatment for this condition includes figuring out what caused the reaction and protecting your skin from further contact. Treatment may also include:  Steroid creams or ointments. Oral steroid  medicines may be needed in more severe cases.  Antibiotics or antibacterial ointments, if a skin infection is present.  Antihistamine lotion or an antihistamine taken by mouth to ease itching.  A bandage (dressing).  Follow these instructions at home: Skin Care  Moisturize your skin as needed.  Apply cool compresses to the affected areas.  Try taking a bath with: ? Epsom salts. Follow the instructions on the packaging.  You can get these at your local pharmacy or grocery store. ? Baking soda. Pour a small amount into the bath as directed by your health care provider. ? Colloidal oatmeal. Follow the instructions on the packaging. You can get this at your local pharmacy or grocery store.  Try applying baking soda paste to your skin. Stir water into baking soda until it reaches a paste-like consistency.  Do not scratch your skin.  Bathe less frequently, such as every other day.  Bathe in lukewarm water. Avoid using hot water. Medicines  Take or apply over-the-counter and prescription medicines only as told by your health care provider.  If you were prescribed an antibiotic medicine, take or apply your antibiotic as told by your health care provider. Do not stop using the antibiotic even if your condition starts to improve. General instructions  Keep all follow-up visits as told by your health care provider. This is important.  Avoid the substance that caused your reaction. If you do not know what caused it, keep a journal to try to track what caused it. Write down: ? What you eat. ? What cosmetic products you use. ? What you drink. ? What you wear in the affected area. This includes jewelry.  If you were given a dressing, take care of it as told by your health care provider. This includes when to change and remove it. Contact a health care provider if:  Your condition does not improve with treatment.  Your condition gets worse.  You have signs of infection such as swelling, tenderness, redness, soreness, or warmth in the affected area.  You have a fever.  You have new symptoms. Get help right away if:  You have a severe headache, neck pain, or neck stiffness.  You vomit.  You feel very sleepy.  You notice red streaks coming from the affected area.  Your bone or joint underneath the affected area becomes painful after the skin has healed.  The affected area turns darker.  You have  difficulty breathing. This information is not intended to replace advice given to you by your health care provider. Make sure you discuss any questions you have with your health care provider. Document Released: 01/31/2000 Document Revised: 07/11/2015 Document Reviewed: 06/20/2014 Elsevier Interactive Patient Education  2018 [employer].

## 2016-12-15 NOTE — Progress Notes (Signed)
Subjective:    Patient ID: Tyler Hinton, male    DOB: [birth year], 41 y.o.   MRN: [id number]  Tyler Hinton is a 41 y.o. male presenting on 12/15/2016 for Rash (bilateral rash on feet x 6 weeks )   HPI Rash on feet New socks 6-8 mos.  Tighter compression around arch and at ankle  Worsened since made changes.  Boots over a year. No other new shoes. - No recent yard work w/ open shoes - No other identifiable triggers for rash.  Social History  Substance Use Topics  . Smoking status: Never Smoker  . Smokeless tobacco: Never Used  . Alcohol use No     Comment: former drinker    Review of Systems Per HPI unless specifically indicated above     Objective:    BP 115/74 (BP Location: Right Arm, Patient Position: Sitting, Cuff Size: Large)   Pulse 77   Temp 98.2 F (36.8 C) (Oral)   Ht 5' 10.5" (1.791 m)   Wt 273 lb (123.8 kg)   BMI 38.62 kg/m   Wt Readings from Last 3 Encounters:  12/15/16 273 lb (123.8 kg)  12/03/16 275 lb 3.2 oz (124.8 kg)  10/29/16 277 lb (125.6 kg)    Physical Exam  Constitutional: He appears well-developed and well-nourished. No distress.  Cardiovascular: Normal rate, regular rhythm and normal heart sounds.   Pulmonary/Chest: Effort normal and breath sounds normal. No respiratory distress.  Abdominal: Soft. Bowel sounds are normal.  Skin: Skin is warm and dry. Rash (sharply demarcated papular rash around ankles and circumferentially at arch of foot) noted.  Psychiatric: He has a normal mood and affect. His speech is normal.         Results for orders placed or performed in visit on 12/03/16  Comprehensive metabolic panel  Result Value Ref Range   Glucose, Bld 88 65 - 99 mg/dL   BUN 12 7 - 25 mg/dL   Creat 2.13 0.86 - 5.78 mg/dL   BUN/Creatinine Ratio NOT APPLICABLE 6 - 22 (calc)   Sodium 142 135 - 146 mmol/L   Potassium 4.7 3.5 - 5.3 mmol/L   Chloride 105 98 - 110 mmol/L   CO2 28 20 - 32 mmol/L   Calcium 9.5 8.6 - 10.3 mg/dL   Total  Protein 7.2 6.1 - 8.1 g/dL   Albumin 4.4 3.6 - 5.1 g/dL   Globulin 2.8 1.9 - 3.7 g/dL (calc)   AG Ratio 1.6 1.0 - 2.5 (calc)   Total Bilirubin 0.6 0.2 - 1.2 mg/dL   Alkaline phosphatase (APISO) 62 40 - 115 U/L   AST 24 10 - 40 U/L   ALT 41 9 - 46 U/L  Lipid panel  Result Value Ref Range   Cholesterol 181 <200 mg/dL   HDL 41 >46 mg/dL   Triglycerides 84 <962 mg/dL   LDL Cholesterol (Calc) 122 (H) mg/dL (calc)   Total CHOL/HDL Ratio 4.4 <5.0 (calc)   Non-HDL Cholesterol (Calc) 140 (H) <130 mg/dL (calc)  HgB X5M  Result Value Ref Range   Hgb A1c MFr Bld 5.4 <5.7 % of total Hgb   Mean Plasma Glucose 108 (calc)   eAG (mmol/L) 6.0 (calc)  TSH  Result Value Ref Range   TSH 1.53 0.40 - 4.50 mIU/L      Assessment & Plan:   Problem List Items Addressed This Visit    None    Visit Diagnoses    Contact dermatitis, unspecified contact dermatitis type, unspecified trigger    -  Primary Presentation c/w contact dermatitis w/ sharply demarcated area of irritation.    Plan: 1. Isolate possible triggers.  Reintroduce one at a time and if rash recurs, that agent is causative. 2. START usint triamcinolong ointment 0.1% bid x 14 days. 3. Start prednisone taper over 7 days Day 1-2: 60 mg, Day 3: 50 mg, Day 4: 40 mg; Day 5: 30 mg; Day 6 20 mg; Day 7: 10 mg then stop. 4. Followup prn 1-2 weeks if symptoms worsen, return, or fail to improve.   Relevant Medications   triamcinolone cream (KENALOG) 0.1 %   predniSONE (DELTASONE) 10 MG tablet      Meds ordered this encounter  Medications  . DISCONTD: lisinopril (PRINIVIL,ZESTRIL) 10 MG tablet    Sig: Take 10 mg by mouth daily.    Refill:  1  . triamcinolone cream (KENALOG) 0.1 %    Sig: Apply 1 application topically 2 (two) times daily.    Dispense:  45 g    Refill:  0    Order Specific Question:   Supervising Provider    Answer:   Smitty CordsKARAMALEGOS, ALEXANDER J [[id number]]  . predniSONE (DELTASONE) 10 MG tablet    Sig: Day 1-2 take 6 pills. Day 3  take 5 pills then reduce by 1 pill each day until complete.    Dispense:  27 tablet    Refill:  0    Order Specific Question:   Supervising Provider    Answer:   Smitty CordsKARAMALEGOS, ALEXANDER J [[id number]]      Follow up plan: Return 1-2 weeks if symptoms worsen or fail to improve.  Wilhelmina McardleLauren Marnee Sherrard, DNP, AGPCNP-BC Adult Gerontology Primary Care Nurse Practitioner [facility] 12/16/2016, 10:53 PM

## 2017-03-05 ENCOUNTER — Other Ambulatory Visit: Payer: Self-pay

## 2017-03-05 ENCOUNTER — Ambulatory Visit (INDEPENDENT_AMBULATORY_CARE_PROVIDER_SITE_OTHER): Payer: 59 | Admitting: Nurse Practitioner

## 2017-03-05 ENCOUNTER — Encounter: Payer: Self-pay | Admitting: Nurse Practitioner

## 2017-03-05 VITALS — BP 129/76 | HR 90 | Temp 98.4°F | Ht 70.5 in | Wt 275.4 lb

## 2017-03-05 DIAGNOSIS — I10 Essential (primary) hypertension: Secondary | ICD-10-CM

## 2017-03-05 DIAGNOSIS — K219 Gastro-esophageal reflux disease without esophagitis: Secondary | ICD-10-CM | POA: Diagnosis not present

## 2017-03-05 MED ORDER — VALSARTAN 160 MG PO TABS: 160.0000 mg | ORAL_TABLET | Freq: Every day | ORAL | 3 refills | Status: DC

## 2017-03-05 NOTE — Patient Instructions (Addendum)
Tyler Hinton, Thank you for coming in to clinic today.  1. STOP your lisinopril because of cough. - START valsartan 160 mg once daily.    2. Continue checking BP at home.  Goal is < 130/80.  We can verify your automatic machine with a manual BP reading at the same time.  Please schedule a follow-up appointment with Tyler Hinton, AGNP. Return in about 6 months (around 09/02/2017) for Hypertension.  If you have any other questions or concerns, please feel free to call the clinic or send a message through MyChart. You may also schedule an earlier appointment if necessary.  You will receive a survey after today's visit either digitally by e-mail or paper by [employer] mail. Your experiences and feedback matter to us.  Please respond so we know how we are doing as we provide care for you.   Tyler McardleLauren Kaisyn Millea, DNP, AGNP-BC Adult Gerontology Nurse Practitioner [facility], [facility]

## 2017-03-05 NOTE — Progress Notes (Signed)
Subjective:    Patient ID: Tyler Hinton [place], male    DOB: 07-18-1975, 42 y.o.   MRN: [id number]  Tyler Hinton Wiemann is a 42 y.o. male presenting on 03/05/2017 for Hypertension   HPI Hypertension - He is checking BP at home or outside of clinic.  Readings 140/80s (but pt is concerned about accuracy of his machine) - Current medications: lisinopril 20 mg once daily, tolerating well without side effects - He is not currently symptomatic. - Pt denies headache, lightheadedness, dizziness, changes in vision, chest tightness/pressure, palpitations, leg swelling, sudden loss of speech or loss of consciousness. - He  reports no regular exercise routine. - His diet is moderate in salt, moderate in fat, and moderate in carbohydrates.  GERD Manageable.  No heartburn taking famotidine once nightly.  OK even if misses one dose.    Social History   Tobacco Use  . Smoking status: Never Smoker  . Smokeless tobacco: Never Used  Substance Use Topics  . Alcohol use: No    Alcohol/week: 0.0 oz    Comment: former drinker  . Drug use: No    Review of Systems Per HPI unless specifically indicated above     Objective:    BP 129/76 (BP Location: Left Arm, Patient Position: Sitting, Cuff Size: Large)   Pulse 90   Temp 98.4 F (36.9 C) (Oral)   Ht 5' 10.5" (1.791 m)   Wt 275 lb 6.4 oz (124.9 kg)   BMI 38.96 kg/m   Wt Readings from Last 3 Encounters:  03/29/17 275 lb 9.6 oz (125 kg)  03/05/17 275 lb 6.4 oz (124.9 kg)  12/15/16 273 lb (123.8 kg)    Physical Exam  General - Obese, well-appearing, NAD HEENT - Normocephalic, atraumatic Neck - supple, non-tender, no LAD, no thyromegaly, no carotid bruit Heart - RRR, no murmurs heard Lungs - Clear throughout all lobes, no wheezing, crackles, or rhonchi. Normal work of breathing. Abdomen - soft, NTND, no masses, no hepatosplenomegaly, active bowel sounds Extremeties - non-tender, no edema, cap refill < 2 seconds, peripheral pulses intact +2  bilaterally Skin - warm, dry Neuro - awake, alert, oriented x3, normal gait Psych - Normal mood and affect, normal behavior   Results for orders placed or performed in visit on 12/03/16  Comprehensive metabolic panel  Result Value Ref Range   Glucose, Bld 88 65 - 99 mg/dL   BUN 12 7 - 25 mg/dL   Creat 4.090.97 8.110.60 - 9.141.35 mg/dL   BUN/Creatinine Ratio NOT APPLICABLE 6 - 22 (calc)   Sodium 142 135 - 146 mmol/L   Potassium 4.7 3.5 - 5.3 mmol/L   Chloride 105 98 - 110 mmol/L   CO2 28 20 - 32 mmol/L   Calcium 9.5 8.6 - 10.3 mg/dL   Total Protein 7.2 6.1 - 8.1 g/dL   Albumin 4.4 3.6 - 5.1 g/dL   Globulin 2.8 1.9 - 3.7 g/dL (calc)   AG Ratio 1.6 1.0 - 2.5 (calc)   Total Bilirubin 0.6 0.2 - 1.2 mg/dL   Alkaline phosphatase (APISO) 62 40 - 115 U/L   AST 24 10 - 40 U/L   ALT 41 9 - 46 U/L  Lipid panel  Result Value Ref Range   Cholesterol 181 <200 mg/dL   HDL 41 >78>40 mg/dL   Triglycerides 84 <295<150 mg/dL   LDL Cholesterol (Calc) 122 (H) mg/dL (calc)   Total CHOL/HDL Ratio 4.4 <5.0 (calc)   Non-HDL Cholesterol (Calc) 140 (H) <130 mg/dL (calc)  HgB A2ZA1c  Result Value Ref Range   Hgb A1c MFr Bld 5.4 <5.7 % of total Hgb   Mean Plasma Glucose 108 (calc)   eAG (mmol/L) 6.0 (calc)  TSH  Result Value Ref Range   TSH 1.53 0.40 - 4.50 mIU/L      Assessment & Plan:   Problem List Items Addressed This Visit      Cardiovascular and Mediastinum   Essential hypertension - Primary    Stable, improved and now at goal of < 130/80. Pt is taking lisinopril 20 mg once daily and tolerating well, but is having side effects of dry hacking cough.  No complications at this time.  Plan: 1. STOP lisinopril. - START valsartan 160 mg once daily. 2. Recent kidney function check was normal. 3. Reviewed DASH eating plan and physical activity.  Encouraged low fat/reducing fried foods. 4. Follow up 6 months or sooner if needed.      Relevant Medications   valsartan (DIOVAN) 160 MG tablet     Digestive    GERD (gastroesophageal reflux disease)    Symptoms currently stable without frequent breakthrough heartburn and no heartburn with missed doses.  Taking H2 blocker and tolerating well.  Continue famotidine once at bedtime.  Can increase to twice daily as needed.         Meds ordered this encounter  Medications  . valsartan (DIOVAN) 160 MG tablet    Sig: Take 1 tablet (160 mg total) by mouth daily.    Dispense:  90 tablet    Refill:  3    Order Specific Question:   Supervising Provider    Answer:   Smitty Cords [[id number]]      Follow up plan: Return in about 6 months (around 09/02/2017) for Hypertension  AND annual physical around 2/11 (Labs).   [name], DNP, AGPCNP-BC Adult Gerontology Primary Care Nurse Practitioner [facility] 04/02/2017, 2:22 PM

## 2017-03-29 ENCOUNTER — Other Ambulatory Visit: Payer: 59

## 2017-03-29 ENCOUNTER — Ambulatory Visit (INDEPENDENT_AMBULATORY_CARE_PROVIDER_SITE_OTHER): Payer: 59 | Admitting: Nurse Practitioner

## 2017-03-29 ENCOUNTER — Other Ambulatory Visit: Payer: Self-pay

## 2017-03-29 ENCOUNTER — Encounter: Payer: Self-pay | Admitting: Nurse Practitioner

## 2017-03-29 VITALS — BP 129/91 | HR 70 | Temp 98.5°F | Ht 70.5 in | Wt 275.6 lb

## 2017-03-29 DIAGNOSIS — Z114 Encounter for screening for human immunodeficiency virus [HIV]: Secondary | ICD-10-CM

## 2017-03-29 DIAGNOSIS — Z0000 Encounter for general adult medical examination without abnormal findings: Secondary | ICD-10-CM

## 2017-03-29 NOTE — Patient Instructions (Addendum)
Tyler Hinton, Thank you for coming in to clinic today.  1. Labs results will be released to MyChart by Wednesday. 2. Work toward healthier eating.  Reduce serving sizes when you don't have choice for fried foods vs baked foods.  Please schedule a follow-up appointment with Tyler Hinton, AGNP. No Follow-up on file.  If you have any other questions or concerns, please feel free to call the clinic or send a message through MyChart. You may also schedule an earlier appointment if necessary.  You will receive a survey after today's visit either digitally by e-mail or paper by [employer] mail. Your experiences and feedback matter to us.  Please respond so we know how we are doing as we provide care for you.   Tyler McardleLauren Vinicio Lynk, DNP, AGNP-BC Adult Gerontology Nurse Practitioner [facility], [facility]    Health Maintenance, Male A healthy lifestyle and preventive care is important for your health and wellness. Ask your health care provider about what schedule of regular examinations is right for you. What should I know about weight and diet? Eat a Healthy Diet  Eat plenty of vegetables, fruits, whole grains, low-fat dairy products, and lean protein.  Do not eat a lot of foods high in solid fats, added sugars, or salt.  Maintain a Healthy Weight Regular exercise can help you achieve or maintain a healthy weight. You should:  Do at least 150 minutes of exercise each week. The exercise should increase your heart rate and make you sweat (moderate-intensity exercise).  Do strength-training exercises at least twice a week.  Watch Your Levels of Cholesterol and Blood Lipids  Have your blood tested for lipids and cholesterol every 5 years starting at 43 years of age. If you are at high risk for heart disease, you should start having your blood tested when you are 43 years old. You may need to have your cholesterol levels checked more often if: ? Your lipid or cholesterol levels are high. ? You  are older than 43 years of age. ? You are at high risk for heart disease.  What should I know about cancer screening? Many types of cancers can be detected early and may often be prevented. Lung Cancer  You should be screened every year for lung cancer if: ? You are a current smoker who has smoked for at least 30 years. ? You are a former smoker who has quit within the past 15 years.  Talk to your health care provider about your screening options, when you should start screening, and how often you should be screened.  Colorectal Cancer  Routine colorectal cancer screening usually begins at 43 years of age and should be repeated every 5-10 years until you are 43 years old. You may need to be screened more often if early forms of precancerous polyps or small growths are found. Your health care provider may recommend screening at an earlier age if you have risk factors for colon cancer.  Your health care provider may recommend using home test kits to check for hidden blood in the stool.  A small camera at the end of a tube can be used to examine your colon (sigmoidoscopy or colonoscopy). This checks for the earliest forms of colorectal cancer.  Prostate and Testicular Cancer  Depending on your age and overall health, your health care provider may do certain tests to screen for prostate and testicular cancer.  Talk to your health care provider about any symptoms or concerns you have about testicular or prostate  cancer.  Skin Cancer  Check your skin from head to toe regularly.  Tell your health care provider about any new moles or changes in moles, especially if: ? There is a change in a mole's size, shape, or color. ? You have a mole that is larger than a pencil eraser.  Always use sunscreen. Apply sunscreen liberally and repeat throughout the day.  Protect yourself by wearing long sleeves, pants, a wide-brimmed hat, and sunglasses when outside.  What should I know about heart  disease, diabetes, and high blood pressure?  If you are 69-44 years of age, have your blood pressure checked every 3-5 years. If you are 62 years of age or older, have your blood pressure checked every year. You should have your blood pressure measured twice-once when you are at a hospital or clinic, and once when you are not at a hospital or clinic. Record the average of the two measurements. To check your blood pressure when you are not at a hospital or clinic, you can use: ? An automated blood pressure machine at a pharmacy. ? A home blood pressure monitor.  Talk to your health care provider about your target blood pressure.  If you are between 44-23 years old, ask your health care provider if you should take aspirin to prevent heart disease.  Have regular diabetes screenings by checking your fasting blood sugar level. ? If you are at a normal weight and have a low risk for diabetes, have this test once every three years after the age of 28. ? If you are overweight and have a high risk for diabetes, consider being tested at a younger age or more often.  A one-time screening for abdominal aortic aneurysm (AAA) by ultrasound is recommended for men aged 65-75 years who are current or former smokers. What should I know about preventing infection? Hepatitis B If you have a higher risk for hepatitis B, you should be screened for this virus. Talk with your health care provider to find out if you are at risk for hepatitis B infection. Hepatitis C Blood testing is recommended for:  Everyone born from [birth year] through 1966.  Anyone with known risk factors for hepatitis C.  Sexually Transmitted Diseases (STDs)  You should be screened each year for STDs including gonorrhea and chlamydia if: ? You are sexually active and are younger than 43 years of age. ? You are older than 43 years of age and your health care provider tells you that you are at risk for this type of infection. ? Your sexual activity  has changed since you were last screened and you are at an increased risk for chlamydia or gonorrhea. Ask your health care provider if you are at risk.  Talk with your health care provider about whether you are at high risk of being infected with HIV. Your health care provider may recommend a prescription medicine to help prevent HIV infection.  What else can I do?  Schedule regular health, dental, and eye exams.  Stay current with your vaccines (immunizations).  Do not use any tobacco products, such as cigarettes, chewing tobacco, and e-cigarettes. If you need help quitting, ask your health care provider.  Limit alcohol intake to no more than 2 drinks per day. One drink equals 12 ounces of beer, 5 ounces of wine, or 1 ounces of hard liquor.  Do not use street drugs.  Do not share needles.  Ask your health care provider for help if you need support or information  about quitting drugs.  Tell your health care provider if you often feel depressed.  Tell your health care provider if you have ever been abused or do not feel safe at home. This information is not intended to replace advice given to you by your health care provider. Make sure you discuss any questions you have with your health care provider. Document Released: 08/01/2007 Document Revised: 10/02/2015 Document Reviewed: 11/06/2014 Elsevier Interactive Patient Education  [employer].

## 2017-03-29 NOTE — Progress Notes (Signed)
Subjective:    Patient ID: Tyler Hinton, male    DOB: 05-23-1975, 42 y.o.   MRN: [id number]  Tyler Hinton is a 42 y.o. male presenting on 03/29/2017 for Annual Exam   HPI  Annual Physical Exam Patient has been feeling well.  They have no acute concerns today. Sleeps 6-7 hours per night usually uninterrupted.  HEALTH MAINTENANCE: Weight/BMI: overweight - stable since January Physical activity: none regularly Diet: fast food 3-4 per week, most home-cooked.  Some vegetables, fried meats, frequent breads and carbs Seatbelt: always Sunscreen: never- only occasionally for prolonged periods - Has worked outside for job in past w/o use of sunscreen HIV: can draw today Optometry: none Dentistry: none  VACCINES: Tetanus: 2016 given  Past Medical History:  Diagnosis Date  . GERD (gastroesophageal reflux disease)   . Headache   . Hypertension   . Plantar fasciitis    in past   Past Surgical History:  Procedure Laterality Date  . CHOLECYSTECTOMY     Social History   Socioeconomic History  . Marital status: Married    Spouse name: Not on file  . Number of children: Not on file  . Years of education: Not on file  . Highest education level: Not on file  Social Needs  . Financial resource strain: Not on file  . Food insecurity - worry: Not on file  . Food insecurity - inability: Not on file  . Transportation needs - medical: Not on file  . Transportation needs - non-medical: Not on file  Occupational History  . Not on file  Tobacco Use  . Smoking status: Never Smoker  . Smokeless tobacco: Never Used  Substance and Sexual Activity  . Alcohol use: No    Alcohol/week: 0.0 oz    Comment: former drinker  . Drug use: No  . Sexual activity: Not on file  Other Topics Concern  . Not on file  Social History Narrative  . Not on file   Family History  Problem Relation Age of Onset  . Cancer Maternal Grandmother   . Diabetes Maternal Grandmother   . Cancer Maternal Grandfather    . Diabetes Maternal Grandfather   . Colon cancer Maternal Aunt    Current Outpatient Medications on File Prior to Visit  Medication Sig  . cetirizine (ZYRTEC) 10 MG tablet Take 1 tablet (10 mg total) by mouth daily.  Marland Kitchen. ibuprofen (ADVIL,MOTRIN) 200 MG tablet Take 200 mg by mouth every 6 (six) hours as needed.  . ranitidine (ZANTAC) 150 MG capsule Take 1 capsule (150 mg total) by mouth at bedtime.  . valsartan (DIOVAN) 160 MG tablet Take 1 tablet (160 mg total) by mouth daily.  . fluticasone (FLONASE) 50 MCG/ACT nasal spray Place 1 spray into both nostrils daily.   No current facility-administered medications on file prior to visit.     Review of Systems  Constitutional: Negative.   HENT: Negative.   Eyes: Negative.   Respiratory: Negative.   Cardiovascular: Negative.   Gastrointestinal: Negative.   Endocrine: Negative.   Genitourinary: Negative.   Musculoskeletal: Positive for arthralgias.  Skin: Negative.   Allergic/Immunologic: Negative.   Neurological: Negative for headaches.  Hematological: Negative.   Psychiatric/Behavioral: Negative.    Per HPI unless specifically indicated above     Objective:    BP (!) 129/91 (BP Location: Right Arm, Patient Position: Sitting, Cuff Size: Large)   Pulse 70   Temp 98.5 F (36.9 C) (Oral)   Ht 5' 10.5" (1.791 m)  Wt 275 lb 9.6 oz (125 kg)   BMI 38.99 kg/m   Wt Readings from Last 3 Encounters:  03/29/17 275 lb 9.6 oz (125 kg)  03/05/17 275 lb 6.4 oz (124.9 kg)  12/15/16 273 lb (123.8 kg)    Physical Exam  General - obese, well-appearing, NAD HEENT - Normocephalic, atraumatic, PERRL, EOMI, patent nares w/o congestion, oropharynx clear, MMM Neck - supple, non-tender, no LAD, no thyromegaly, no carotid bruit Heart - RRR, no murmurs heard Lungs - Clear throughout all lobes, no wheezing, crackles, or rhonchi. Normal work of breathing. Abdomen - soft, NTND, no masses, no hepatosplenomegaly, active bowel sounds GU - Pt deferred  today Extremeties - non-tender, no edema, cap refill < 2 seconds, peripheral pulses intact +2 bilaterally Skin - warm, dry, no rashes Neuro - awake, alert, oriented x3, CN II-X intact, intact muscle strength 5/5 bilaterally, intact distal sensation to light touch, normal coordination, normal gait Psych - Normal mood and affect, normal behavior   Results for orders placed or performed in visit on 12/03/16  Comprehensive metabolic panel  Result Value Ref Range   Glucose, Bld 88 65 - 99 mg/dL   BUN 12 7 - 25 mg/dL   Creat 1.19 1.47 - 8.29 mg/dL   BUN/Creatinine Ratio NOT APPLICABLE 6 - 22 (calc)   Sodium 142 135 - 146 mmol/L   Potassium 4.7 3.5 - 5.3 mmol/L   Chloride 105 98 - 110 mmol/L   CO2 28 20 - 32 mmol/L   Calcium 9.5 8.6 - 10.3 mg/dL   Total Protein 7.2 6.1 - 8.1 g/dL   Albumin 4.4 3.6 - 5.1 g/dL   Globulin 2.8 1.9 - 3.7 g/dL (calc)   AG Ratio 1.6 1.0 - 2.5 (calc)   Total Bilirubin 0.6 0.2 - 1.2 mg/dL   Alkaline phosphatase (APISO) 62 40 - 115 U/L   AST 24 10 - 40 U/L   ALT 41 9 - 46 U/L  Lipid panel  Result Value Ref Range   Cholesterol 181 <200 mg/dL   HDL 41 >56 mg/dL   Triglycerides 84 <213 mg/dL   LDL Cholesterol (Calc) 122 (H) mg/dL (calc)   Total CHOL/HDL Ratio 4.4 <5.0 (calc)   Non-HDL Cholesterol (Calc) 140 (H) <130 mg/dL (calc)  HgB Y8M  Result Value Ref Range   Hgb A1c MFr Bld 5.4 <5.7 % of total Hgb   Mean Plasma Glucose 108 (calc)   eAG (mmol/L) 6.0 (calc)  TSH  Result Value Ref Range   TSH 1.53 0.40 - 4.50 mIU/L      Assessment & Plan:   Problem List Items Addressed This Visit    None    Visit Diagnoses    Annual physical exam    -  Primary   Relevant Orders   Lipid panel   Comprehensive metabolic panel   Hemoglobin A1c   CBC with Differential/Platelet   HIV antibody   Encounter for screening for HIV       Relevant Orders   HIV antibody      Physical exam with no new findings.  Well adult with no acute concerns.  Plan: 1. Obtain  health maintenance screenings. 2. Educated pt about heart healthy diet. 3. Discussed weight loss, increasing physical activity. 4. Labs for screening DM, lipids, anemia, HIV (low risk). 5. Return 1 year for annual physical.   Follow up plan: Return in about 1 year (around 03/29/2018) for Annual Physical.  [name], DNP, AGPCNP-BC Adult Gerontology Primary Care Nurse Practitioner  [facility] 03/29/2017, 12:28 PM

## 2017-03-30 LAB — COMPREHENSIVE METABOLIC PANEL
AG Ratio: 1.4 (calc) (ref 1.0–2.5)
ALT: 32 U/L (ref 9–46)
AST: 20 U/L (ref 10–40)
Albumin: 4.2 g/dL (ref 3.6–5.1)
Alkaline phosphatase (APISO): 55 U/L (ref 40–115)
BUN: 10 mg/dL (ref 7–25)
CO2: 28 mmol/L (ref 20–32)
Calcium: 9.5 mg/dL (ref 8.6–10.3)
Chloride: 107 mmol/L (ref 98–110)
Creat: 0.87 mg/dL (ref 0.60–1.35)
Globulin: 2.9 g/dL (calc) (ref 1.9–3.7)
Glucose, Bld: 89 mg/dL (ref 65–99)
Potassium: 4.5 mmol/L (ref 3.5–5.3)
Sodium: 142 mmol/L (ref 135–146)
Total Bilirubin: 0.4 mg/dL (ref 0.2–1.2)
Total Protein: 7.1 g/dL (ref 6.1–8.1)

## 2017-03-30 LAB — LIPID PANEL
Cholesterol: 183 mg/dL (ref <200)
HDL: 43 mg/dL (ref >40)
LDL Cholesterol (Calc): 118 mg/dL (calc) — ABNORMAL HIGH
Non-HDL Cholesterol (Calc): 140 mg/dL (calc) — ABNORMAL HIGH (ref <130)
Total CHOL/HDL Ratio: 4.3 (calc) (ref <5.0)
Triglycerides: 112 mg/dL (ref <150)

## 2017-03-30 LAB — HEMOGLOBIN A1C
Hgb A1c MFr Bld: 5.5 % of total Hgb (ref <5.7)
Mean Plasma Glucose: 111 (calc)
eAG (mmol/L): 6.2 (calc)

## 2017-03-30 LAB — CBC WITH DIFFERENTIAL/PLATELET
Basophils Absolute: 39 cells/uL (ref 0–200)
Basophils Relative: 0.6 %
Eosinophils Absolute: 163 cells/uL (ref 15–500)
Eosinophils Relative: 2.5 %
HCT: 42.8 % (ref 38.5–50.0)
Hemoglobin: 14.7 g/dL (ref 13.2–17.1)
Lymphs Abs: 2139 cells/uL (ref 850–3900)
MCH: 30 pg (ref 27.0–33.0)
MCHC: 34.3 g/dL (ref 32.0–36.0)
MCV: 87.3 fL (ref 80.0–100.0)
MPV: 12.5 fL (ref 7.5–12.5)
Monocytes Relative: 10.2 %
Neutro Abs: 3497 cells/uL (ref 1500–7800)
Neutrophils Relative %: 53.8 %
Platelets: 314 10*3/uL (ref 140–400)
RBC: 4.9 10*6/uL (ref 4.20–5.80)
RDW: 12.8 % (ref 11.0–15.0)
Total Lymphocyte: 32.9 %
WBC mixed population: 663 cells/uL (ref 200–950)
WBC: 6.5 10*3/uL (ref 3.8–10.8)

## 2017-03-30 LAB — HIV ANTIBODY (ROUTINE TESTING W REFLEX): HIV 1&2 Ab, 4th Generation: NONREACTIVE

## 2017-04-02 ENCOUNTER — Encounter: Payer: Self-pay | Admitting: Nurse Practitioner

## 2017-04-02 NOTE — Assessment & Plan Note (Addendum)
Stable, improved and now at goal of < 130/80. Pt is taking lisinopril 20 mg once daily and tolerating well, but is having side effects of dry hacking cough.  No complications at this time.  Plan: 1. STOP lisinopril. - START valsartan 160 mg once daily. 2. Recent kidney function check was normal. 3. Reviewed DASH eating plan and physical activity.  Encouraged low fat/reducing fried foods. 4. Follow up 6 months or sooner if needed.

## 2017-04-02 NOTE — Assessment & Plan Note (Signed)
Symptoms currently stable without frequent breakthrough heartburn and no heartburn with missed doses.  Taking H2 blocker and tolerating well.  Continue famotidine once at bedtime.  Can increase to twice daily as needed.

## 2017-09-02 ENCOUNTER — Ambulatory Visit: Payer: 59 | Admitting: Nurse Practitioner

## 2017-09-06 ENCOUNTER — Encounter: Payer: Self-pay | Admitting: Nurse Practitioner

## 2017-09-06 ENCOUNTER — Ambulatory Visit (INDEPENDENT_AMBULATORY_CARE_PROVIDER_SITE_OTHER): Payer: 59 | Admitting: Nurse Practitioner

## 2017-09-06 ENCOUNTER — Other Ambulatory Visit: Payer: Self-pay

## 2017-09-06 VITALS — BP 123/73 | HR 71 | Temp 99.2°F | Ht 70.5 in | Wt 265.8 lb

## 2017-09-06 DIAGNOSIS — G4440 Drug-induced headache, not elsewhere classified, not intractable: Secondary | ICD-10-CM | POA: Diagnosis not present

## 2017-09-06 DIAGNOSIS — G44229 Chronic tension-type headache, not intractable: Secondary | ICD-10-CM

## 2017-09-06 DIAGNOSIS — I10 Essential (primary) hypertension: Secondary | ICD-10-CM | POA: Diagnosis not present

## 2017-09-06 MED ORDER — PREDNISONE 10 MG PO TABS: ORAL_TABLET | ORAL | 0 refills | Status: DC

## 2017-09-06 MED ORDER — BACLOFEN 10 MG PO TABS: 5.0000 mg | ORAL_TABLET | Freq: Every evening | ORAL | 0 refills | Status: DC | PRN

## 2017-09-06 MED ORDER — VALSARTAN 160 MG PO TABS: 160.0000 mg | ORAL_TABLET | Freq: Every day | ORAL | 3 refills | Status: DC

## 2017-09-06 NOTE — Patient Instructions (Addendum)
Tyler Hinton,   Thank you for coming in to clinic today.  1. Tension type headaches with some rebound headache - Take prednisone taper 10 mg tablets Day 1 (Today): Take 6 pills at one time Day 2: Take 6 pills  Day 3: Take 5 pills Day 4: Take 4 pills Day 5: Take 3 pills Day 6: Take 2 pills  Day 7: Take 1 pill then stop.  - START baclofen 5-10 mg at bedtime for muscle relaxer.  This will help release neck and shoulder muscles that are tight that could cause your tension headaches.  Please schedule a follow-up appointment with [name], AGNP. Return in about 3 months (around 12/07/2017) for headaches and hypertension.  If you have any other questions or concerns, please feel free to call the clinic or send a message through MyChart. You may also schedule an earlier appointment if necessary.  You will receive a survey after today's visit either digitally by e-mail or paper by [employer]. Your experiences and feedback matter to [place].  Please respond so we know how we are doing as we provide care for you.   [name], DNP, AGNP-BC Adult Gerontology Nurse Practitioner [facility], [facility]   Tension Headache A tension headache is a feeling of pain, pressure, or aching that is often felt over the front and sides of the head. The pain can be dull, or it can feel tight (constricting). Tension headaches are not normally associated with nausea or vomiting, and they do not get worse with physical activity. Tension headaches can last from 30 minutes to several days. This is the most common type of headache. CAUSES The exact cause of this condition is not known. Tension headaches often begin after stress, anxiety, or depression. Other triggers may include:  Alcohol.  Too much caffeine, or caffeine withdrawal.  Respiratory infections, such as colds, flu, or sinus infections.  Dental problems or teeth clenching.  Fatigue.  Holding your head and neck in the same position  for a long period of time, such as while using a computer.  Smoking. SYMPTOMS Symptoms of this condition include:  A feeling of pressure around the head.  Dull, aching head pain.  Pain felt over the front and sides of the head.  Tenderness in the muscles of the head, neck, and shoulders. DIAGNOSIS This condition may be diagnosed based on your symptoms and a physical exam. Tests may be done, such as a CT scan or an MRI of your head. These tests may be done if your symptoms are severe or unusual. TREATMENT This condition may be treated with lifestyle changes and medicines to help relieve symptoms. HOME CARE INSTRUCTIONS Managing Pain  Take over-the-counter and prescription medicines only as told by your health care provider.  Lie down in a dark, quiet room when you have a headache.  If directed, apply ice to the head and neck area: ? Put ice in a plastic bag. ? Place a towel between your skin and the bag. ? Leave the ice on for 20 minutes, 2-3 times per day.  Use a heating pad or a hot shower to apply heat to the head and neck area as told by your health care provider. Eating and Drinking  Eat meals on a regular schedule.  Limit alcohol use.  Decrease your caffeine intake, or stop using caffeine. General Instructions  Keep all follow-up visits as told by your health care provider. This is important.  Keep a headache journal to help find  out what may trigger your headaches. For example, write down: ? What you eat and drink. ? How much sleep you get. ? Any change to your diet or medicines.  Try massage or other relaxation techniques.  Limit stress.  Sit up straight, and avoid tensing your muscles.  Do not use tobacco products, including cigarettes, chewing tobacco, or e-cigarettes. If you need help quitting, ask your health care provider.  Exercise regularly as told by your health care provider.  Get 7-9 hours of sleep, or the amount recommended by your health care  provider. SEEK MEDICAL CARE IF:  Your symptoms are not helped by medicine.  You have a headache that is different from what you normally experience.  You have nausea or you vomit.  You have a fever. SEEK IMMEDIATE MEDICAL CARE IF:  Your headache becomes severe.  You have repeated vomiting.  You have a stiff neck.  You have a loss of vision.  You have problems with speech.  You have pain in your eye or ear.  You have muscular weakness or loss of muscle control.  You lose your balance or you have trouble walking.  You feel faint or you pass out.  You have confusion. This information is not intended to replace advice given to you by your health care provider. Make sure you discuss any questions you have with your health care provider. Document Released: 02/02/2005 Document Revised: 10/24/2014 Document Reviewed: 05/28/2014 Elsevier Interactive Patient Education  2017 [employer]

## 2017-09-06 NOTE — Progress Notes (Signed)
Subjective:    Patient ID: Tyler Hinton, male    DOB: 1975-11-29, 42 y.o.   MRN: [id number]  Cono Gebhard is a 42 y.o. male presenting on 09/06/2017 for Hypertension and Headache (frequent headaches. The pt states he have notice some improvement since he stopped the Ibuprophen.)   HPI Hypertension - Current medications: valsartan, tolerating well without side effects - He is symptomatic with headache daily. - Pt denies lightheadedness, dizziness, changes in vision, chest tightness/pressure, palpitations, leg swelling, sudden loss of speech or loss of consciousness.  Headaches Has headaches at least 1 per day.  85% of the time.  Sometimes they improve without medications.  IN am, often forgets he has a headache.  Some days this wakes him up. - starts in occipital region,  - dull ache, sometimes sharp.  Bad headache also hurts his eyes. Not usually hearing whooshing. Has postural changes with lightheadedness and pain with bending forward and standing up.  Stays well hydrated.  Has worked outside / inside with heat regularly.  Social History   Tobacco Use  . Smoking status: Never Smoker  . Smokeless tobacco: Never Used  Substance Use Topics  . Alcohol use: No    Alcohol/week: 0.0 oz    Comment: former drinker  . Drug use: No    Review of Systems Per HPI unless specifically indicated above     Objective:    BP 123/73 (BP Location: Right Arm, Patient Position: Sitting, Cuff Size: Large)   Pulse 71   Temp 99.2 F (37.3 C) (Oral)   Ht 5' 10.5" (1.791 m)   Wt 265 lb 12.8 oz (120.6 kg)   BMI 37.60 kg/m   Wt Readings from Last 3 Encounters:  09/06/17 265 lb 12.8 oz (120.6 kg)  03/29/17 275 lb 9.6 oz (125 kg)  03/05/17 275 lb 6.4 oz (124.9 kg)    Physical Exam  Constitutional: He is oriented to person, place, and time. He appears well-developed and well-nourished. No distress.  HENT:  Head: Normocephalic and atraumatic.  Musculoskeletal:  Mild para-cervical muscle  hypertonicity with trapezius hypertonicity  Neurological: He is alert and oriented to person, place, and time. He has normal strength and normal reflexes. No cranial nerve deficit or sensory deficit. He displays a negative Romberg sign. Gait normal.  Skin: Skin is warm and dry.  Psychiatric: He has a normal mood and affect. His behavior is normal.  Vitals reviewed.  Results for orders placed or performed in visit on 03/29/17  Lipid panel  Result Value Ref Range   Cholesterol 183 <200 mg/dL   HDL 43 >82 mg/dL   Triglycerides 956 <213 mg/dL   LDL Cholesterol (Calc) 118 (H) mg/dL (calc)   Total CHOL/HDL Ratio 4.3 <5.0 (calc)   Non-HDL Cholesterol (Calc) 140 (H) <130 mg/dL (calc)  Comprehensive metabolic panel  Result Value Ref Range   Glucose, Bld 89 65 - 99 mg/dL   BUN 10 7 - 25 mg/dL   Creat 0.86 5.78 - 4.69 mg/dL   BUN/Creatinine Ratio NOT APPLICABLE 6 - 22 (calc)   Sodium 142 135 - 146 mmol/L   Potassium 4.5 3.5 - 5.3 mmol/L   Chloride 107 98 - 110 mmol/L   CO2 28 20 - 32 mmol/L   Calcium 9.5 8.6 - 10.3 mg/dL   Total Protein 7.1 6.1 - 8.1 g/dL   Albumin 4.2 3.6 - 5.1 g/dL   Globulin 2.9 1.9 - 3.7 g/dL (calc)   AG Ratio 1.4 1.0 - 2.5 (calc)  Total Bilirubin 0.4 0.2 - 1.2 mg/dL   Alkaline phosphatase (APISO) 55 40 - 115 U/L   AST 20 10 - 40 U/L   ALT 32 9 - 46 U/L  Hemoglobin A1c  Result Value Ref Range   Hgb A1c MFr Bld 5.5 <5.7 % of total Hgb   Mean Plasma Glucose 111 (calc)   eAG (mmol/L) 6.2 (calc)  CBC with Differential/Platelet  Result Value Ref Range   WBC 6.5 3.8 - 10.8 Thousand/uL   RBC 4.90 4.20 - 5.80 Million/uL   Hemoglobin 14.7 13.2 - 17.1 g/dL   HCT 16.1 09.6 - 04.5 %   MCV 87.3 80.0 - 100.0 fL   MCH 30.0 27.0 - 33.0 pg   MCHC 34.3 32.0 - 36.0 g/dL   RDW 40.9 81.1 - 91.4 %   Platelets 314 140 - 400 Thousand/uL   MPV 12.5 7.5 - 12.5 fL   Neutro Abs 3,497 1,500 - 7,800 cells/uL   Lymphs Abs 2,139 850 - 3,900 cells/uL   WBC mixed population 663 200 -  950 cells/uL   Eosinophils Absolute 163 15 - 500 cells/uL   Basophils Absolute 39 0 - 200 cells/uL   Neutrophils Relative % 53.8 %   Total Lymphocyte 32.9 %   Monocytes Relative 10.2 %   Eosinophils Relative 2.5 %   Basophils Relative 0.6 %  HIV antibody  Result Value Ref Range   HIV 1&2 Ab, 4th Generation NON-REACTIVE NON-REACTI      Assessment & Plan:   Problem List Items Addressed This Visit      Cardiovascular and Mediastinum   Essential hypertension Stable except daily headaches.  Not likely associated with hypertension given normotensive BP today. Refill valsartan today, continue at current dose.  Follow-up 3 months.   Relevant Medications   valsartan (DIOVAN) 160 MG tablet    Other Visit Diagnoses    Chronic tension-type headache, not intractable    -  Primary   Rebound headache     Chronic tension type headache, complicated by regular OTC med use.  Has reduced ibuprofen with mild improvement.  Plan: 1. Start baclofen 5-10 mg at bedtime prn for muscle hypertension 2. Start prednisone taper 7 days (See AVS for taper instructions). 3. Follow-up 3 months.  Can consider neurology referral in future if no improvement.      Meds ordered this encounter  Medications  . baclofen (LIORESAL) 10 MG tablet    Sig: Take 0.5-1 tablets (5-10 mg total) by mouth at bedtime as needed for muscle spasms.    Dispense:  30 each    Refill:  0    Order Specific Question:   Supervising Provider    Answer:   Smitty Cords [[id number]]  . predniSONE (DELTASONE) 10 MG tablet    Sig: Day 1-2 take 6 pills. Day 3 take 5 pills then reduce by 1 pill each day.    Dispense:  27 tablet    Refill:  0    Order Specific Question:   Supervising Provider    Answer:   Smitty Cords [[id number]]  . valsartan (DIOVAN) 160 MG tablet    Sig: Take 1 tablet (160 mg total) by mouth daily.    Dispense:  90 tablet    Refill:  3    Order Specific Question:   Supervising Provider    Answer:    Smitty Cords [[id number]]    Follow up plan: Return in about 3 months (around 12/07/2017) for headaches and hypertension.  Wilhelmina McardleLauren Zailen Albarran, DNP, AGPCNP-BC Adult Gerontology Primary Care Nurse Practitioner [facility] 09/06/2017, 4:24 PM

## 2017-09-21 ENCOUNTER — Telehealth: Payer: Self-pay | Admitting: Nurse Practitioner

## 2017-09-21 DIAGNOSIS — G44221 Chronic tension-type headache, intractable: Secondary | ICD-10-CM

## 2017-09-21 MED ORDER — SUMATRIPTAN SUCCINATE 50 MG PO TABS: 50.0000 mg | ORAL_TABLET | Freq: Every day | ORAL | 0 refills | Status: DC | PRN

## 2017-09-21 NOTE — Telephone Encounter (Signed)
Pt called states that what you ask him to do did not work was requesting a referral. Pt call bac # is [phone]

## 2017-09-21 NOTE — Telephone Encounter (Signed)
Patient will start sumatriptan.  Followup in clinic with me before neurology referral.  There are other treatments to try before referring.

## 2017-09-22 NOTE — Telephone Encounter (Signed)
Attempted to contact the pt, no answer. LMOM to return my call.  

## 2017-09-22 NOTE — Telephone Encounter (Signed)
The pt was notified and appointment scheduled for next Thursday.

## 2017-09-30 ENCOUNTER — Encounter: Payer: Self-pay | Admitting: Nurse Practitioner

## 2017-09-30 ENCOUNTER — Ambulatory Visit (INDEPENDENT_AMBULATORY_CARE_PROVIDER_SITE_OTHER): Payer: 59 | Admitting: Nurse Practitioner

## 2017-09-30 ENCOUNTER — Other Ambulatory Visit: Payer: Self-pay

## 2017-09-30 VITALS — BP 123/72 | HR 69 | Temp 98.3°F | Ht 70.5 in | Wt 267.6 lb

## 2017-09-30 DIAGNOSIS — G44229 Chronic tension-type headache, not intractable: Secondary | ICD-10-CM | POA: Insufficient documentation

## 2017-09-30 DIAGNOSIS — K219 Gastro-esophageal reflux disease without esophagitis: Secondary | ICD-10-CM | POA: Diagnosis not present

## 2017-09-30 DIAGNOSIS — G44221 Chronic tension-type headache, intractable: Secondary | ICD-10-CM

## 2017-09-30 MED ORDER — SUMATRIPTAN SUCCINATE 50 MG PO TABS: 50.0000 mg | ORAL_TABLET | Freq: Every day | ORAL | 1 refills | Status: DC | PRN

## 2017-09-30 MED ORDER — AMITRIPTYLINE HCL 10 MG PO TABS: 10.0000 mg | ORAL_TABLET | Freq: Every day | ORAL | 5 refills | Status: DC

## 2017-09-30 MED ORDER — OMEPRAZOLE 20 MG PO CPDR: 20.0000 mg | DELAYED_RELEASE_CAPSULE | Freq: Every day | ORAL | 3 refills | Status: DC

## 2017-09-30 NOTE — Progress Notes (Signed)
Subjective:    Patient ID: Tyler Hinton, male    DOB: 1975/05/08, 42 y.o.   MRN: [id number]  Tyler Hinton is a 42 y.o. male presenting on 09/30/2017 for Headache (intermittent headaches. Pt notice some improvement , but still having persistent headaches )   HPI Headache Headache intensity headache intensity improving.  Sumatriptan is helping relieve pain, but does not completely resolve headache.  Headaches are worst first thing in the morning with more pain in the occipital region but moves forward to frontal throughout the day. - Baclofen did not help - Is not taking any ibuprofen or acetaminophen.   - Sumatriptan was not taken today.  Headache intensity currently is 2 out of 10.  Patient notes if headache reaches level 6 out of 10, he has severe headache for up to 3 days. - He has had no headache free days in the last month.  Today is his least headache intensity in the last month. - No prior sleep study or sleep apnea diagnosis.  Epworth Sleepiness Scale Patient's Answer Chance of dozing off under normal circumstances  Sitting and reading  1   Watching TV 0 0 = Never  Sitting inactive in a public place 1 1 = Slight chance  As a passenger in a car for an hour without a break 1 2 = Moderate chance  Lying down to rest in the afternoon when circumstances permit 3 3 = High chance  Sitting and talking to someone 0   Sitting quietly after a lunch without alcohol 0   In a car, while stopped for a few minutes in traffic 0                                                              Total Score: 6    0-7: It is unlikely that you are abnormally sleepy. 8-9: You have an average amount of daytime sleepiness. >9: POSITIVE - Recommend further evaluation, sleep specialist or sleep study (>16-24 = severe)   STOP-Bang OSA (scoring y/n) Snoring y   Tiredness n   Observed apneas n   Pressure HTN n   BMI > 35 kg/m2 y   Age > 67  n   Neck (male >17 in; Male >73 in)  y 27.75 in  Gender male y     OSA risk low (0-2)  OSA risk intermediate (3-4)  OSA risk high (5+)  Total: 4    Acid Reflux Still has most significant reflux at bedtime.  Awakens in the middle of the night with gurgling and bad taste of stomach acid and mouth. - 4-5 pm dinner - Bed 8:30-9p - No alcohol intake - Prilosec is helping control symptoms if taken daily.  Is able to miss 1-2 days if eating no tomatoes.    Social History   Tobacco Use  . Smoking status: Never Smoker  . Smokeless tobacco: Never Used  Substance Use Topics  . Alcohol use: No    Alcohol/week: 0.0 standard drinks    Comment: former drinker  . Drug use: No    Review of Systems Per HPI unless specifically indicated above     Objective:    BP 123/72 (BP Location: Right Arm, Patient Position: Sitting, Cuff Size: Large)   Pulse 69   Temp 98.3  F (36.8 C) (Oral)   Ht 5' 10.5" (1.791 m)   Wt 267 lb 9.6 oz (121.4 kg)   BMI 37.85 kg/m   Wt Readings from Last 3 Encounters:  09/30/17 267 lb 9.6 oz (121.4 kg)  09/06/17 265 lb 12.8 oz (120.6 kg)  03/29/17 275 lb 9.6 oz (125 kg)    Physical Exam  Constitutional: He is oriented to person, place, and time. He appears well-developed and well-nourished. No distress.  HENT:  Head: Normocephalic and atraumatic.  Cardiovascular: Normal rate, regular rhythm, S1 normal, S2 normal, normal heart sounds and intact distal pulses.  Pulmonary/Chest: Effort normal and breath sounds normal. No respiratory distress.  Abdominal: Soft. Bowel sounds are normal. He exhibits no distension. There is no hepatosplenomegaly. There is no tenderness. No hernia.  Neurological: He is alert and oriented to person, place, and time. He has normal strength and normal reflexes. No cranial nerve deficit or sensory deficit. He displays a negative Romberg sign. Gait normal.  Skin: Skin is warm and dry.  Psychiatric: He has a normal mood and affect. His behavior is normal.  Vitals reviewed.    Results for orders placed or  performed in visit on 03/29/17  Lipid panel  Result Value Ref Range   Cholesterol 183 <200 mg/dL   HDL 43 >16>40 mg/dL   Triglycerides 109112 <604<150 mg/dL   LDL Cholesterol (Calc) 118 (H) mg/dL (calc)   Total CHOL/HDL Ratio 4.3 <5.0 (calc)   Non-HDL Cholesterol (Calc) 140 (H) <130 mg/dL (calc)  Comprehensive metabolic panel  Result Value Ref Range   Glucose, Bld 89 65 - 99 mg/dL   BUN 10 7 - 25 mg/dL   Creat 5.400.87 9.810.60 - 1.911.35 mg/dL   BUN/Creatinine Ratio NOT APPLICABLE 6 - 22 (calc)   Sodium 142 135 - 146 mmol/L   Potassium 4.5 3.5 - 5.3 mmol/L   Chloride 107 98 - 110 mmol/L   CO2 28 20 - 32 mmol/L   Calcium 9.5 8.6 - 10.3 mg/dL   Total Protein 7.1 6.1 - 8.1 g/dL   Albumin 4.2 3.6 - 5.1 g/dL   Globulin 2.9 1.9 - 3.7 g/dL (calc)   AG Ratio 1.4 1.0 - 2.5 (calc)   Total Bilirubin 0.4 0.2 - 1.2 mg/dL   Alkaline phosphatase (APISO) 55 40 - 115 U/L   AST 20 10 - 40 U/L   ALT 32 9 - 46 U/L  Hemoglobin A1c  Result Value Ref Range   Hgb A1c MFr Bld 5.5 <5.7 % of total Hgb   Mean Plasma Glucose 111 (calc)   eAG (mmol/L) 6.2 (calc)  CBC with Differential/Platelet  Result Value Ref Range   WBC 6.5 3.8 - 10.8 Thousand/uL   RBC 4.90 4.20 - 5.80 Million/uL   Hemoglobin 14.7 13.2 - 17.1 g/dL   HCT 47.842.8 29.538.5 - 62.150.0 %   MCV 87.3 80.0 - 100.0 fL   MCH 30.0 27.0 - 33.0 pg   MCHC 34.3 32.0 - 36.0 g/dL   RDW 30.812.8 65.711.0 - 84.615.0 %   Platelets 314 140 - 400 Thousand/uL   MPV 12.5 7.5 - 12.5 fL   Neutro Abs 3,497 1,500 - 7,800 cells/uL   Lymphs Abs 2,139 850 - 3,900 cells/uL   WBC mixed population 663 200 - 950 cells/uL   Eosinophils Absolute 163 15 - 500 cells/uL   Basophils Absolute 39 0 - 200 cells/uL   Neutrophils Relative % 53.8 %   Total Lymphocyte 32.9 %   Monocytes Relative 10.2 %  Eosinophils Relative 2.5 %   Basophils Relative 0.6 %  HIV antibody  Result Value Ref Range   HIV 1&2 Ab, 4th Generation NON-REACTIVE NON-REACTI      Assessment & Plan:   Problem List Items Addressed This  Visit      Digestive   GERD (gastroesophageal reflux disease) - Primary    Symptoms currently stable without frequent breakthrough heartburn and no heartburn with missed doses.  Taking omeprazole OTC and tolerating well.  Continue omeprazole 20 mg 1 tablet p.o. daily at bedtime.  Continue to avoid food triggers.  Can consider raising head of bed with bed risers.  Consider GI referral in future for upper Endo for evaluation of esophagitis.  Defer for now his symptoms are well controlled.  Follow-up 3 to 6 months      Relevant Medications   omeprazole (PRILOSEC OTC) 20 MG tablet   omeprazole (PRILOSEC) 20 MG capsule     Nervous and Auditory   Chronic tension-type headache, intractable    Patient with chronic tension type headache initially thought to be rebound headaches from over-the-counter analgesics.  Patient has had 4 weeks off analgesics except sumatriptan.  Very low suspicion for intracranial mass as cranial nerve exam and neuro exam are normal.  Possible complication of headaches with undiagnosed sleep apnea.  No additional symptoms of sleep apnea, but patient is at risk with positive stop bang screening great Epworth sleepiness scale is at low risk for sleep apnea. - Continue sumatriptan as needed for headache intensity greater than 4 out of 10. - Start amitriptyline 10 mg 1 tablet by mouth at bedtime.  Start this in 7 days if headaches persist after stopping daily sumatriptan. -Consider neurology appointment for any additional medications or evaluation.  If neurology appointment is requested by patient, sleep study should also be requested for sleep apnea evaluation.  Patient is in agreement with this plan. -Follow-up in 3 months.      Relevant Medications   amitriptyline (ELAVIL) 10 MG tablet   SUMAtriptan (IMITREX) 50 MG tablet      Meds ordered this encounter  Medications  . omeprazole (PRILOSEC) 20 MG capsule    Sig: Take 1 capsule (20 mg total) by mouth daily.    Dispense:   30 capsule    Refill:  3  . amitriptyline (ELAVIL) 10 MG tablet    Sig: Take 1 tablet (10 mg total) by mouth at bedtime.    Dispense:  30 tablet    Refill:  5    Order Specific Question:   Supervising Provider    Answer:   Smitty CordsKARAMALEGOS, ALEXANDER J [[id number]]  . SUMAtriptan (IMITREX) 50 MG tablet    Sig: Take 1 tablet (50 mg total) by mouth daily as needed for headache.    Dispense:  12 tablet    Refill:  1    Order Specific Question:   Supervising Provider    Answer:   Smitty CordsKARAMALEGOS, ALEXANDER J [[id number]]    Follow up plan: Return in about 3 months (around 12/31/2017) for headaches.  Wilhelmina McardleLauren Ameerah Huffstetler, DNP, AGPCNP-BC Adult Gerontology Primary Care Nurse Practitioner [facility] 09/30/2017, 6:49 PM

## 2017-09-30 NOTE — Assessment & Plan Note (Signed)
Patient with chronic tension type headache initially thought to be rebound headaches from over-the-counter analgesics.  Patient has had 4 weeks off analgesics except sumatriptan.  Very low suspicion for intracranial mass as cranial nerve exam and neuro exam are normal.  Possible complication of headaches with undiagnosed sleep apnea.  No additional symptoms of sleep apnea, but patient is at risk with positive stop bang screening great Epworth sleepiness scale is at low risk for sleep apnea. - Continue sumatriptan as needed for headache intensity greater than 4 out of 10. - Start amitriptyline 10 mg 1 tablet by mouth at bedtime.  Start this in 7 days if headaches persist after stopping daily sumatriptan. -Consider neurology appointment for any additional medications or evaluation.  If neurology appointment is requested by patient, sleep study should also be requested for sleep apnea evaluation.  Patient is in agreement with this plan. -Follow-up in 3 months.

## 2017-09-30 NOTE — Assessment & Plan Note (Signed)
Symptoms currently stable without frequent breakthrough heartburn and no heartburn with missed doses.  Taking omeprazole OTC and tolerating well.  Continue omeprazole 20 mg 1 tablet p.o. daily at bedtime.  Continue to avoid food triggers.  Can consider raising head of bed with bed risers.  Consider GI referral in future for upper Endo for evaluation of esophagitis.  Defer for now his symptoms are well controlled.  Follow-up 3 to 6 months

## 2017-09-30 NOTE — Patient Instructions (Addendum)
Tyler Hinton,   Thank you for coming in to clinic today.  1. STOP all headache medicine for 1 week.  If worse tomorrow, go ahead and start amitriptyline.  If it continues to  - If headaches do not improve in next 7 days, start amitriptyline as planned. - START amitriptyline 10 mg tablet.  Take 1 tablet by mouth at bedtime to prevent headache. - We will refer to neurology if no improvement in 2-3 weeks on amitriptyline.  Call clinic for referral.  We will also place sleep study order if referring to neurology.  2. Continue omeprazole 20 mg tablet once daily for 30 days.  Then resume every other day dosing if needed.   Please schedule a follow-up appointment with Wilhelmina McardleLauren Yussuf Sawyers, AGNP. Return in about 3 months (around 12/31/2017) for headaches.  If you have any other questions or concerns, please feel free to call the clinic or send a message through MyChart. You may also schedule an earlier appointment if necessary.  You will receive a survey after today's visit either digitally by e-mail or paper by [employer] mail. Your experiences and feedback matter to us.  Please respond so we know how we are doing as we provide care for you.   Wilhelmina McardleLauren Trejon Duford, DNP, AGNP-BC Adult Gerontology Nurse Practitioner [facility], [facility]

## 2017-10-07 ENCOUNTER — Other Ambulatory Visit: Payer: Self-pay | Admitting: Nurse Practitioner

## 2017-10-07 DIAGNOSIS — G44229 Chronic tension-type headache, not intractable: Secondary | ICD-10-CM

## 2017-10-07 DIAGNOSIS — G4440 Drug-induced headache, not elsewhere classified, not intractable: Secondary | ICD-10-CM

## 2017-10-14 ENCOUNTER — Telehealth: Payer: Self-pay

## 2017-10-14 DIAGNOSIS — G4440 Drug-induced headache, not elsewhere classified, not intractable: Secondary | ICD-10-CM

## 2017-10-14 DIAGNOSIS — G44229 Chronic tension-type headache, not intractable: Secondary | ICD-10-CM

## 2017-10-14 MED ORDER — BACLOFEN 10 MG PO TABS: 5.0000 mg | ORAL_TABLET | Freq: Every evening | ORAL | 2 refills | Status: DC | PRN

## 2017-10-14 NOTE — Telephone Encounter (Signed)
Refill request from pharmacy for Baclofen 10 MG -Take 1/2 to 1 tablet po at hs as needed for muscle spasms.

## 2017-12-10 ENCOUNTER — Ambulatory Visit: Payer: 59 | Admitting: Nurse Practitioner

## 2017-12-30 ENCOUNTER — Other Ambulatory Visit: Payer: Self-pay

## 2017-12-30 ENCOUNTER — Ambulatory Visit (INDEPENDENT_AMBULATORY_CARE_PROVIDER_SITE_OTHER): Payer: 59 | Admitting: Nurse Practitioner

## 2017-12-30 VITALS — BP 114/71 | HR 67 | Temp 98.6°F | Ht 70.5 in | Wt 271.4 lb

## 2017-12-30 DIAGNOSIS — G44229 Chronic tension-type headache, not intractable: Secondary | ICD-10-CM | POA: Diagnosis not present

## 2017-12-30 DIAGNOSIS — Z6838 Body mass index (BMI) 38.0-38.9, adult: Secondary | ICD-10-CM

## 2017-12-30 DIAGNOSIS — I10 Essential (primary) hypertension: Secondary | ICD-10-CM

## 2017-12-30 MED ORDER — VALSARTAN 160 MG PO TABS: 160.0000 mg | ORAL_TABLET | Freq: Every day | ORAL | 3 refills | Status: DC

## 2017-12-30 NOTE — Patient Instructions (Addendum)
Romelle StarcherBryan Novitsky,   Thank you for coming in to clinic today.  1. Continue your valsartan 160 mg once daily.  2. Keep working to live a healthy and active lifestyle.  3.  It is ok to take occasional ibuprofen for aches and pains.  Limit to naproxen to 440mg  per dose and more than twice daily. - Take less than 1-2 times per week to prevent rebound headaches in future.  Please schedule a follow-up appointment with Wilhelmina McardleLauren Keisuke Hollabaugh, AGNP. Return in about 6 months (around 06/30/2018).  If you have any other questions or concerns, please feel free to call the clinic or send a message through MyChart. You may also schedule an earlier appointment if necessary.  You will receive a survey after today's visit either digitally by e-mail or paper by [employer] mail. Your experiences and feedback matter to us.  Please respond so we know how we are doing as we provide care for you.   Wilhelmina McardleLauren Sharlize Hoar, DNP, AGNP-BC Adult Gerontology Nurse Practitioner [facility], [facility]

## 2017-12-30 NOTE — Progress Notes (Signed)
Subjective:    Patient ID: Tyler Hinton, male    DOB: 1975/12/13, 42 y.o.   MRN: [id number]  Tyler Hinton is a 42 y.o. male presenting on 12/30/2017 for Hypertension and Headache (headaches have improved since the patient decreased the amount of pain medication he was taking for the headaches )   HPI Hypertension - He is not checking BP at home or outside of clinic.    - Current medications: valsartan 160 mg once daily, tolerating well without side effects - He is not currently symptomatic. - Pt denies headache, lightheadedness, dizziness, changes in vision, chest tightness/pressure, palpitations, leg swelling, sudden loss of speech or loss of consciousness. - He  reports no regular exercise routine. - His diet is moderate in salt, moderate in fat, and moderate in carbohydrates.   Headaches Significant improvement since stopping all pain medications.  Reduced to 1 cup tea of caffeine per day. Only has headache (mild tension-type) about 1-2 times per month and usually does not require medications for pain.  Social History   Tobacco Use  . Smoking status: Never Smoker  . Smokeless tobacco: Never Used  Substance Use Topics  . Alcohol use: No    Alcohol/week: 0.0 standard drinks    Comment: former drinker  . Drug use: No    Review of Systems Per HPI unless specifically indicated above     Objective:    BP 114/71 (BP Location: Right Arm, Patient Position: Sitting, Cuff Size: Large)   Pulse 67   Temp 98.6 F (37 C) (Oral)   Ht 5' 10.5" (1.791 m)   Wt 271 lb 6.4 oz (123.1 kg)   BMI 38.39 kg/m   Wt Readings from Last 3 Encounters:  12/30/17 271 lb 6.4 oz (123.1 kg)  09/30/17 267 lb 9.6 oz (121.4 kg)  09/06/17 265 lb 12.8 oz (120.6 kg)    Physical Exam  Constitutional: He is oriented to person, place, and time. He appears well-developed and well-nourished. No distress.  HENT:  Head: Normocephalic and atraumatic.  Neck: Normal range of motion. Neck supple. Carotid bruit is  not present.  Cardiovascular: Normal rate, regular rhythm, S1 normal, S2 normal, normal heart sounds and intact distal pulses.  Pulmonary/Chest: Effort normal and breath sounds normal. No respiratory distress.  Musculoskeletal: He exhibits no edema (pedal).  Neurological: He is alert and oriented to person, place, and time. He has normal strength and normal reflexes. No cranial nerve deficit or sensory deficit. He displays a negative Romberg sign. Gait normal.  Skin: Skin is warm and dry. Capillary refill takes less than 2 seconds.  Psychiatric: He has a normal mood and affect. His behavior is normal.  Vitals reviewed.    Results for orders placed or performed in visit on 03/29/17  Lipid panel  Result Value Ref Range   Cholesterol 183 <200 mg/dL   HDL 43 >78 mg/dL   Triglycerides 295 <621 mg/dL   LDL Cholesterol (Calc) 118 (H) mg/dL (calc)   Total CHOL/HDL Ratio 4.3 <5.0 (calc)   Non-HDL Cholesterol (Calc) 140 (H) <130 mg/dL (calc)  Comprehensive metabolic panel  Result Value Ref Range   Glucose, Bld 89 65 - 99 mg/dL   BUN 10 7 - 25 mg/dL   Creat 3.08 6.57 - 8.46 mg/dL   BUN/Creatinine Ratio NOT APPLICABLE 6 - 22 (calc)   Sodium 142 135 - 146 mmol/L   Potassium 4.5 3.5 - 5.3 mmol/L   Chloride 107 98 - 110 mmol/L   CO2 28 20 -  32 mmol/L   Calcium 9.5 8.6 - 10.3 mg/dL   Total Protein 7.1 6.1 - 8.1 g/dL   Albumin 4.2 3.6 - 5.1 g/dL   Globulin 2.9 1.9 - 3.7 g/dL (calc)   AG Ratio 1.4 1.0 - 2.5 (calc)   Total Bilirubin 0.4 0.2 - 1.2 mg/dL   Alkaline phosphatase (APISO) 55 40 - 115 U/L   AST 20 10 - 40 U/L   ALT 32 9 - 46 U/L  Hemoglobin A1c  Result Value Ref Range   Hgb A1c MFr Bld 5.5 <5.7 % of total Hgb   Mean Plasma Glucose 111 (calc)   eAG (mmol/L) 6.2 (calc)  CBC with Differential/Platelet  Result Value Ref Range   WBC 6.5 3.8 - 10.8 Thousand/uL   RBC 4.90 4.20 - 5.80 Million/uL   Hemoglobin 14.7 13.2 - 17.1 g/dL   HCT 69.6 29.5 - 28.4 %   MCV 87.3 80.0 - 100.0 fL    MCH 30.0 27.0 - 33.0 pg   MCHC 34.3 32.0 - 36.0 g/dL   RDW 13.2 44.0 - 10.2 %   Platelets 314 140 - 400 Thousand/uL   MPV 12.5 7.5 - 12.5 fL   Neutro Abs 3,497 1,500 - 7,800 cells/uL   Lymphs Abs 2,139 850 - 3,900 cells/uL   WBC mixed population 663 200 - 950 cells/uL   Eosinophils Absolute 163 15 - 500 cells/uL   Basophils Absolute 39 0 - 200 cells/uL   Neutrophils Relative % 53.8 %   Total Lymphocyte 32.9 %   Monocytes Relative 10.2 %   Eosinophils Relative 2.5 %   Basophils Relative 0.6 %  HIV antibody  Result Value Ref Range   HIV 1&2 Ab, 4th Generation NON-REACTIVE NON-REACTI      Assessment & Plan:   Problem List Items Addressed This Visit      Cardiovascular and Mediastinum   Essential hypertension - Primary    Controlled and remains now at goal of < 130/80. Pt is taking valsartan 160 mg once daily and tolerating well.  No complications at this time. Last Kidney function WNL.  Plan: 1. Continue valsartan 160 mg once daily. 2. Recent kidney function check was normal. 3. Reviewed DASH eating plan and physical activity.  Encouraged low fat/reducing fried foods. - Weight loss 10-15 lbs encouraged over next 6-12 months  4. Follow up 6 months or sooner if needed.      Relevant Medications   valsartan (DIOVAN) 160 MG tablet   Other Relevant Orders   Basic Metabolic Panel (BMET)     Nervous and Auditory   Chronic tension-type headache, not intractable    Patient with chronic tension type headache now resolved off of OTC analgesics.  Was also having rebound headache component.  Patient satisfied with current quality of life.  - Continue ibuprofen or Aleeve OTC only as needed and limit to less than 2 x per week. -Consider neurology appointment for any additional medications or evaluation. -Follow-up in 3 months.        Other   Class 2 severe obesity due to excess calories with serious comorbidity and body mass index (BMI) of 38.0 to 38.9 in adult [facility])    Discussed  need for patient to reduce overall oral intake and increase physical activity.  Patient not fully motivated for weight loss, but is willing to continue making small changes to reduce sugars. Recommend 10-15 lbs loss over next 6-12 months.    Meds ordered this encounter  Medications  . valsartan (  DIOVAN) 160 MG tablet    Sig: Take 1 tablet (160 mg total) by mouth daily.    Dispense:  90 tablet    Refill:  3    Order Specific Question:   Supervising Provider    Answer:   Smitty CordsKARAMALEGOS, ALEXANDER J [[id number]]    Follow up plan: Return in about 6 months (around 06/30/2018).  Tyler McardleLauren Nichoals Heyde, DNP, AGPCNP-BC Adult Gerontology Primary Care Nurse Practitioner [facility] 12/30/2017, 4:17 PM

## 2018-01-06 ENCOUNTER — Encounter: Payer: Self-pay | Admitting: Nurse Practitioner

## 2018-01-06 DIAGNOSIS — Z6838 Body mass index (BMI) 38.0-38.9, adult: Secondary | ICD-10-CM

## 2018-01-06 DIAGNOSIS — E6609 Other obesity due to excess calories: Secondary | ICD-10-CM | POA: Insufficient documentation

## 2018-01-06 NOTE — Assessment & Plan Note (Signed)
Patient with chronic tension type headache now resolved off of OTC analgesics.  Was also having rebound headache component.  Patient satisfied with current quality of life.  - Continue ibuprofen or Aleeve OTC only as needed and limit to less than 2 x per week. -Consider neurology appointment for any additional medications or evaluation. -Follow-up in 3 months.

## 2018-01-06 NOTE — Assessment & Plan Note (Signed)
Controlled and remains now at goal of < 130/80. Pt is taking valsartan 160 mg once daily and tolerating well.  No complications at this time. Last Kidney function WNL.  Plan: 1. Continue valsartan 160 mg once daily. 2. Recent kidney function check was normal. 3. Reviewed DASH eating plan and physical activity.  Encouraged low fat/reducing fried foods. - Weight loss 10-15 lbs encouraged over next 6-12 months  4. Follow up 6 months or sooner if needed.

## 2018-01-10 IMAGING — CT CT HAND*R* W/O CM
3 series · 9 of 33 positions shown, 11 images · non-contrast
Comparison: Plain films right hand 12/22/2015.

CLINICAL DATA: The the patient suffered a right fifth metacarpal
fracture after hitting a wall on 12/22/2015. Status post closed
reduction of the fracture. Subsequent encounter.

EXAM:
CT OF THE RIGHT HAND WITHOUT CONTRAST
TECHNIQUE: Multidetector CT imaging of the right hand was performed according
to the standard protocol. Multiplanar CT image reconstructions were
also generated.

[Series 7: axial st · axial · 0.34mm/px · z∈[+82,+82]mm · 1 of 222 slices shown, 2 images]
[im 120/222  soft-tissue]
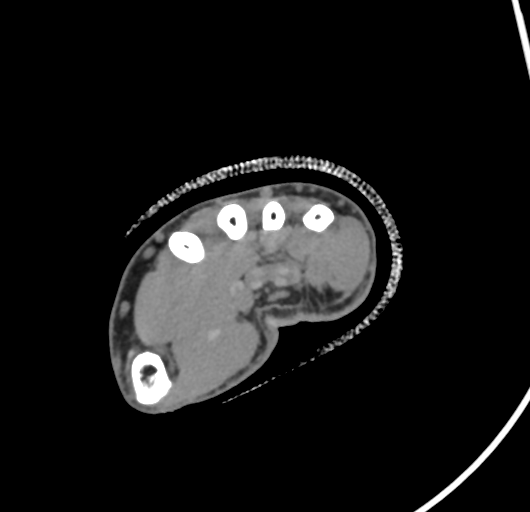
[im 120/222  bone]
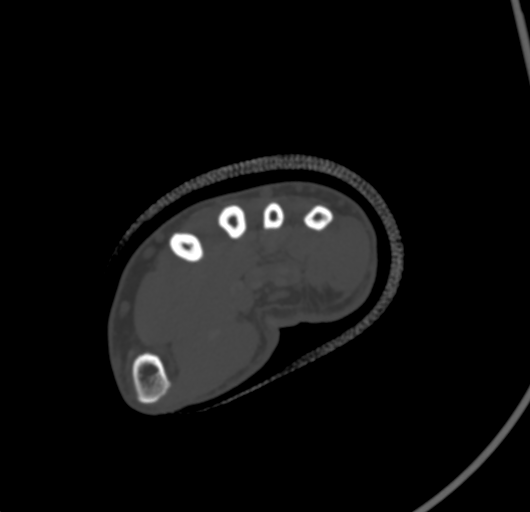

[Series 9: cor st · coronal · 0.35mm/px · 3 of 108 slices shown]
[im 22/108  bone]
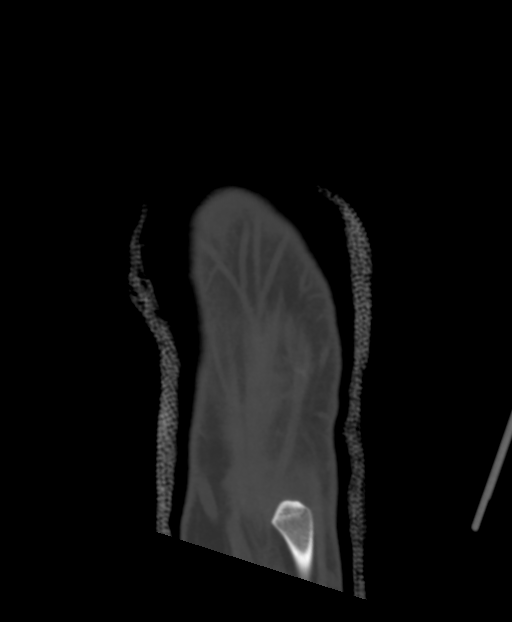
[im 43/108  bone]
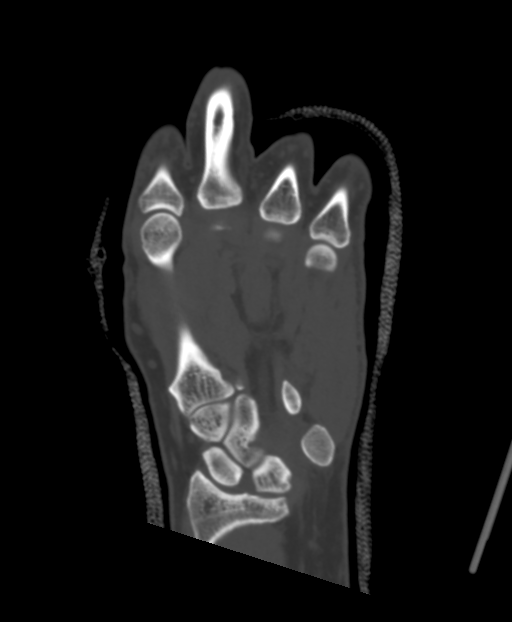
[im 65/108  bone]
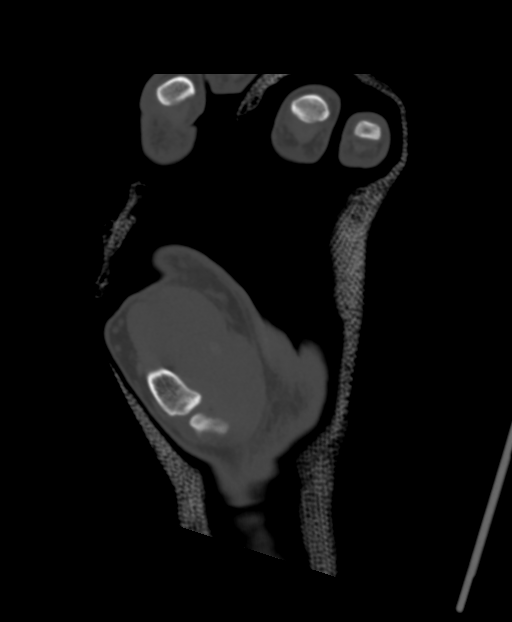

[Series 11: sag st · sagittal · 0.18mm/px · 5 of 120 slices shown, 6 images]
[im 40/120  bone]
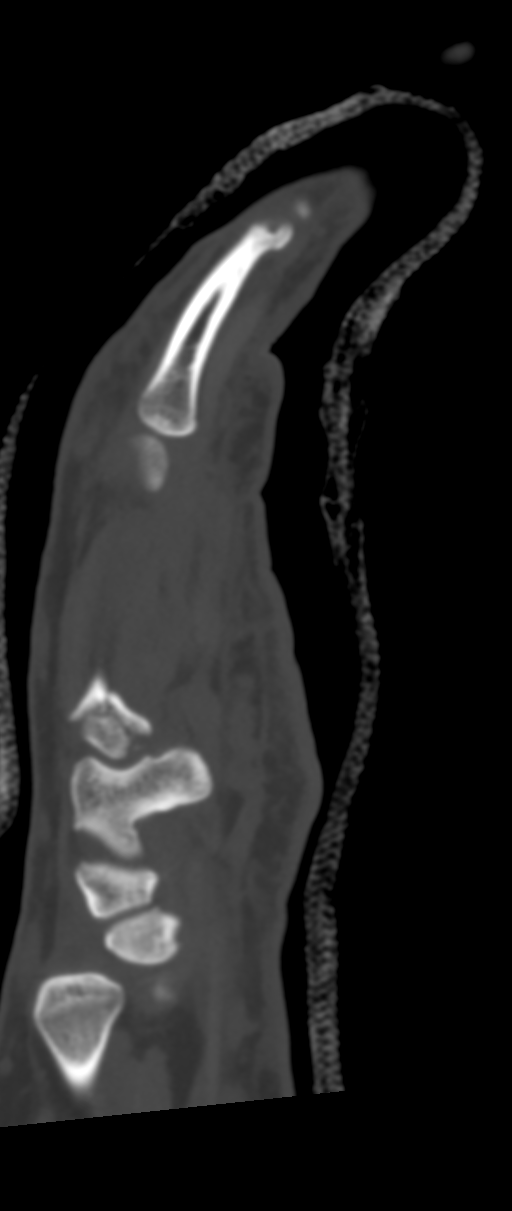
[im 50/120  bone]
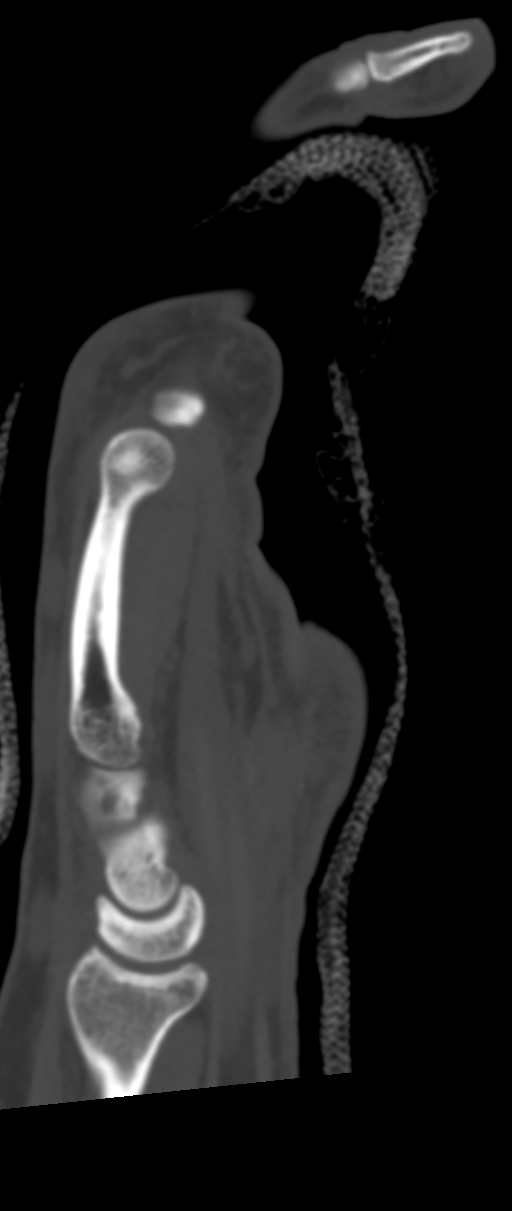
[im 60/120  soft-tissue]
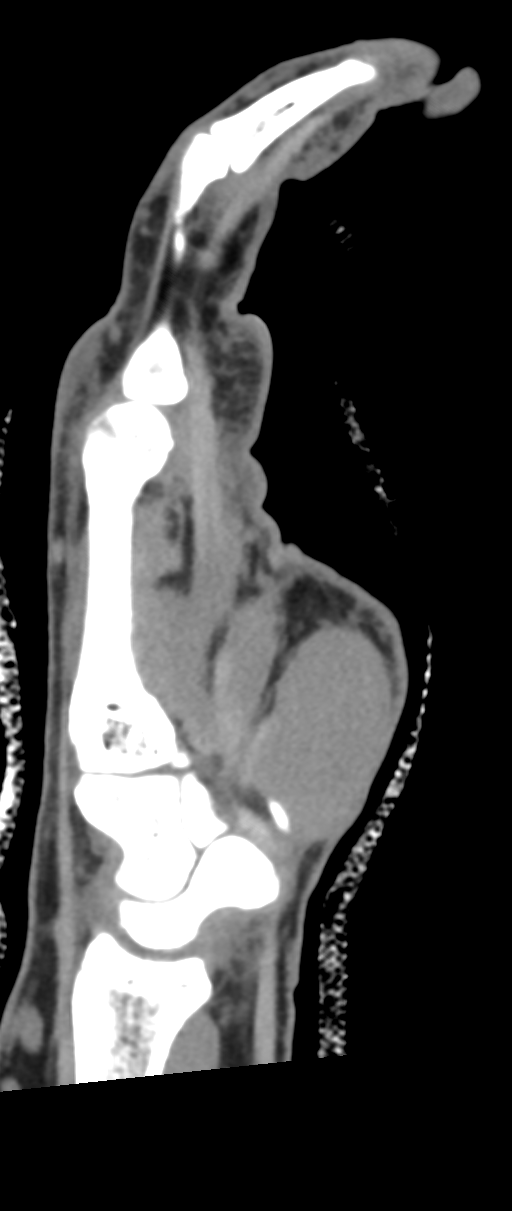
[im 60/120  bone]
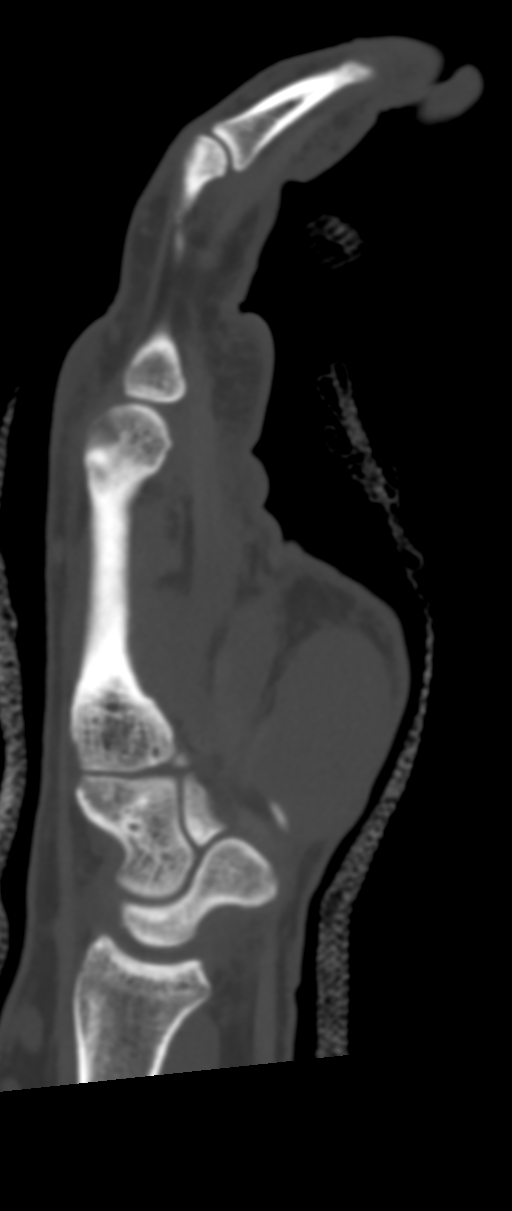
[im 70/120  bone]
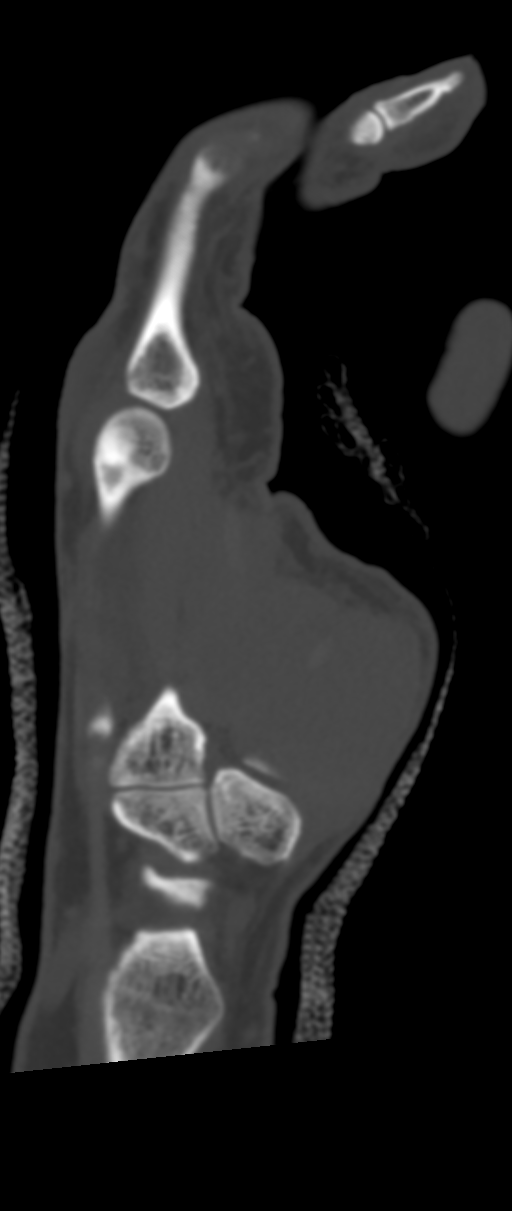
[im 80/120  bone]
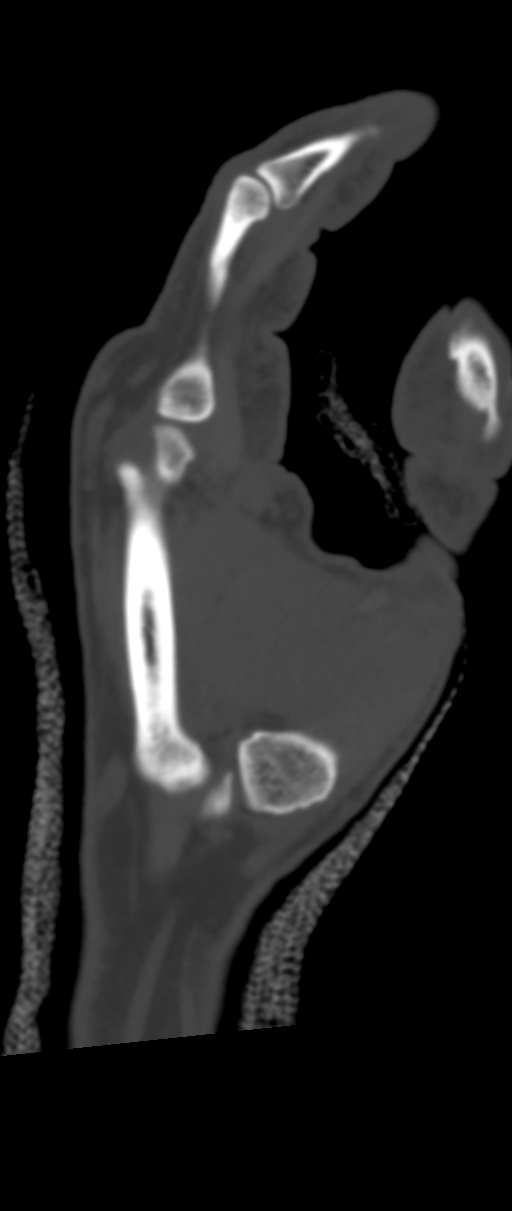

[9 of 33 positions shown; findings below may reference images not displayed]

FINDINGS: Bones/Joint/Cartilage

Again seen is a mildly comminuted fracture through the base of the
fifth metacarpal. The fracture has a transverse component through
the proximal metaphysis with a vertical component extending through
the central aspect of the articular surface. The main proximal
fracture fragment demonstrates volar angulation of approximately 30
degrees with volar subluxation off the articular surface of the
hamate. Step-off at the articular surface of up to 0.2 cm is seen at
the central fracture line. The central portion of the medial
fracture fragment is inferiorly angulated approximately 30 degrees
at the articular surface. The minimal callus formation is seen about
the fracture.

Ligaments

Suboptimally assessed by CT.

Muscles and Tendons

Unremarkable.

Soft tissues

Unremarkable.
IMPRESSION: Mildly comminuted fracture of the base of fifth metacarpal with
step-off at the articular surface and volar subluxation of the
proximal fracture fragment off the articular surface of the hamate
as described above. Minimal callus formation is seen about the
fracture.

## 2018-01-31 ENCOUNTER — Other Ambulatory Visit: Payer: Self-pay | Admitting: Nurse Practitioner

## 2018-01-31 DIAGNOSIS — G4440 Drug-induced headache, not elsewhere classified, not intractable: Secondary | ICD-10-CM

## 2018-01-31 DIAGNOSIS — G44229 Chronic tension-type headache, not intractable: Secondary | ICD-10-CM

## 2018-02-28 ENCOUNTER — Other Ambulatory Visit: Payer: Self-pay | Admitting: Nurse Practitioner

## 2018-02-28 DIAGNOSIS — K219 Gastro-esophageal reflux disease without esophagitis: Secondary | ICD-10-CM

## 2018-03-31 ENCOUNTER — Other Ambulatory Visit: Payer: 59

## 2018-03-31 ENCOUNTER — Encounter: Payer: 59 | Admitting: Nurse Practitioner

## 2018-05-08 ENCOUNTER — Other Ambulatory Visit: Payer: Self-pay | Admitting: Nurse Practitioner

## 2018-05-08 DIAGNOSIS — G4440 Drug-induced headache, not elsewhere classified, not intractable: Secondary | ICD-10-CM

## 2018-05-08 DIAGNOSIS — G44229 Chronic tension-type headache, not intractable: Secondary | ICD-10-CM

## 2018-06-30 ENCOUNTER — Ambulatory Visit: Payer: 59 | Admitting: Nurse Practitioner

## 2018-07-09 ENCOUNTER — Other Ambulatory Visit: Payer: Self-pay | Admitting: Nurse Practitioner

## 2018-07-09 DIAGNOSIS — K219 Gastro-esophageal reflux disease without esophagitis: Secondary | ICD-10-CM

## 2018-09-12 ENCOUNTER — Other Ambulatory Visit: Payer: Self-pay

## 2018-09-12 ENCOUNTER — Encounter: Payer: Self-pay | Admitting: Nurse Practitioner

## 2018-09-12 ENCOUNTER — Ambulatory Visit (INDEPENDENT_AMBULATORY_CARE_PROVIDER_SITE_OTHER): Payer: 59 | Admitting: Nurse Practitioner

## 2018-09-12 VITALS — BP 129/78 | HR 70 | Ht 70.5 in | Wt 275.2 lb

## 2018-09-12 DIAGNOSIS — G44209 Tension-type headache, unspecified, not intractable: Secondary | ICD-10-CM | POA: Diagnosis not present

## 2018-09-12 DIAGNOSIS — I10 Essential (primary) hypertension: Secondary | ICD-10-CM

## 2018-09-12 DIAGNOSIS — G8929 Other chronic pain: Secondary | ICD-10-CM

## 2018-09-12 DIAGNOSIS — M542 Cervicalgia: Secondary | ICD-10-CM

## 2018-09-12 DIAGNOSIS — K219 Gastro-esophageal reflux disease without esophagitis: Secondary | ICD-10-CM

## 2018-09-12 MED ORDER — OMEPRAZOLE 20 MG PO CPDR: 20.0000 mg | DELAYED_RELEASE_CAPSULE | Freq: Every day | ORAL | 1 refills | Status: DC

## 2018-09-12 MED ORDER — VALSARTAN 160 MG PO TABS: 160.0000 mg | ORAL_TABLET | Freq: Every day | ORAL | 3 refills | Status: DC

## 2018-09-12 MED ORDER — TIZANIDINE HCL 4 MG PO TABS: 4.0000 mg | ORAL_TABLET | Freq: Four times a day (QID) | ORAL | 1 refills | Status: DC | PRN

## 2018-09-12 NOTE — Progress Notes (Signed)
Subjective:    Patient ID: Tyler StarcherBryan Harral, male    DOB: 04/15/1975, 43 y.o.   MRN: [id number]  Tyler Hinton is a 43 y.o. male presenting on 09/12/2018 for Hypertension and Neck Pain (intermittent stiffness in the neck and headache. )  HPI Hypertension - He is checking BP at home or outside of clinic.  Readings usually around SBP 130 DBP 80 - Current medications: valsartan 160 mg once daily, tolerating well without side effects - He is not currently symptomatic. - Pt denies headache, lightheadedness, dizziness, changes in vision, chest tightness/pressure, palpitations, leg swelling, sudden loss of speech or loss of consciousness. - He  reports no regular exercise routine. - His diet is moderate in salt, moderate in fat, and moderate in carbohydrates.   Neck Pain/headaches Patient wakes up with stiffness in neck followed by headaches usually.  These occur 3-4 times per week.  Always starts when he wakes up or when at work if holding neck in a specific position for extended period of time.  Muscles feel tired and gets tension. Patient notes he sleeps on his stomach.  Has tried to change, but usually wakes up on stomach.  Social History   Tobacco Use  . Smoking status: Never Smoker  . Smokeless tobacco: Never Used  Substance Use Topics  . Alcohol use: No    Alcohol/week: 0.0 standard drinks    Comment: former drinker  . Drug use: No    Review of Systems Per HPI unless specifically indicated above     Objective:    BP 129/78 (BP Location: Right Arm, Patient Position: Sitting, Cuff Size: Large)   Pulse 70   Ht 5' 10.5" (1.791 m)   Wt 275 lb 3.2 oz (124.8 kg)   BMI 38.93 kg/m   Wt Readings from Last 3 Encounters:  09/12/18 275 lb 3.2 oz (124.8 kg)  12/30/17 271 lb 6.4 oz (123.1 kg)  09/30/17 267 lb 9.6 oz (121.4 kg)    Physical Exam Vitals signs reviewed.  Constitutional:      General: He is awake. He is not in acute distress.    Appearance: He is well-developed.  HENT:   Head: Normocephalic and atraumatic.  Neck:     Musculoskeletal: Normal range of motion and neck supple. Pain with movement and muscular tenderness present.     Vascular: No carotid bruit.     Comments: Muscular hypertonicity of Right side of neck, Left shoulder Cardiovascular:     Rate and Rhythm: Normal rate and regular rhythm.     Pulses:          Radial pulses are 2+ on the right side and 2+ on the left side.       Posterior tibial pulses are 1+ on the right side and 1+ on the left side.     Heart sounds: Normal heart sounds, S1 normal and S2 normal.  Pulmonary:     Effort: Pulmonary effort is normal. No respiratory distress.     Breath sounds: Normal breath sounds and air entry.  Skin:    General: Skin is warm and dry.  Neurological:     Mental Status: He is alert and oriented to person, place, and time.  Psychiatric:        Attention and Perception: Attention normal.        Mood and Affect: Mood and affect normal.        Behavior: Behavior normal. Behavior is cooperative.      Results for orders placed or  performed in visit on 03/29/17  Lipid panel  Result Value Ref Range   Cholesterol 183 <200 mg/dL   HDL 43 >40 mg/dL   Triglycerides 112 <150 mg/dL   LDL Cholesterol (Calc) 118 (H) mg/dL (calc)   Total CHOL/HDL Ratio 4.3 <5.0 (calc)   Non-HDL Cholesterol (Calc) 140 (H) <130 mg/dL (calc)  Comprehensive metabolic panel  Result Value Ref Range   Glucose, Bld 89 65 - 99 mg/dL   BUN 10 7 - 25 mg/dL   Creat 0.87 0.60 - 1.35 mg/dL   BUN/Creatinine Ratio NOT APPLICABLE 6 - 22 (calc)   Sodium 142 135 - 146 mmol/L   Potassium 4.5 3.5 - 5.3 mmol/L   Chloride 107 98 - 110 mmol/L   CO2 28 20 - 32 mmol/L   Calcium 9.5 8.6 - 10.3 mg/dL   Total Protein 7.1 6.1 - 8.1 g/dL   Albumin 4.2 3.6 - 5.1 g/dL   Globulin 2.9 1.9 - 3.7 g/dL (calc)   AG Ratio 1.4 1.0 - 2.5 (calc)   Total Bilirubin 0.4 0.2 - 1.2 mg/dL   Alkaline phosphatase (APISO) 55 40 - 115 U/L   AST 20 10 - 40 U/L    ALT 32 9 - 46 U/L  Hemoglobin A1c  Result Value Ref Range   Hgb A1c MFr Bld 5.5 <5.7 % of total Hgb   Mean Plasma Glucose 111 (calc)   eAG (mmol/L) 6.2 (calc)  CBC with Differential/Platelet  Result Value Ref Range   WBC 6.5 3.8 - 10.8 Thousand/uL   RBC 4.90 4.20 - 5.80 Million/uL   Hemoglobin 14.7 13.2 - 17.1 g/dL   HCT 42.8 38.5 - 50.0 %   MCV 87.3 80.0 - 100.0 fL   MCH 30.0 27.0 - 33.0 pg   MCHC 34.3 32.0 - 36.0 g/dL   RDW 12.8 11.0 - 15.0 %   Platelets 314 140 - 400 Thousand/uL   MPV 12.5 7.5 - 12.5 fL   Neutro Abs 3,497 1,500 - 7,800 cells/uL   Lymphs Abs 2,139 850 - 3,900 cells/uL   WBC mixed population 663 200 - 950 cells/uL   Eosinophils Absolute 163 15 - 500 cells/uL   Basophils Absolute 39 0 - 200 cells/uL   Neutrophils Relative % 53.8 %   Total Lymphocyte 32.9 %   Monocytes Relative 10.2 %   Eosinophils Relative 2.5 %   Basophils Relative 0.6 %  HIV antibody  Result Value Ref Range   HIV 1&2 Ab, 4th Generation NON-REACTIVE NON-REACTI      Assessment & Plan:   Problem List Items Addressed This Visit      Cardiovascular and Mediastinum   Essential hypertension Controlled hypertension.  BP goal < 130/80.  Pt is not currently working on lifestyle modifications.  Taking medications tolerating well without side effects. No current complications.  Plan: 1. Continue taking valsartan 160 mg once daily 2. Obtain labs next visit.  Last kidney function WNL, normal electrolytes  3. Encouraged heart healthy diet and increasing exercise to 30 minutes most days of the week. 4. Check BP 1-2 x per week at home, keep log, and bring to clinic at next appointment. 5. Follow up 6 months.     Relevant Medications   valsartan (DIOVAN) 160 MG tablet     Digestive   GERD (gastroesophageal reflux disease) Currently well controlled on omeprazole 20 mg once daily.  Plan: 1. Continue omeprazole 20 mg once daily. Side effects discussed. Pt wants to continue med. 2. Avoid diet  triggers. Reviewed need to seek care if globus sensation, difficulty swallowing, s/sx of GI bleed. 3. Follow up as needed and in 6 months.    Relevant Medications   omeprazole (PRILOSEC) 20 MG capsule    Other Visit Diagnoses    Neck pain, chronic    -  Primary   Relevant Medications   tiZANidine (ZANAFLEX) 4 MG tablet   Tension headache       Relevant Medications   tiZANidine (ZANAFLEX) 4 MG tablet      Neck pain and tension headache likely related to neck muscle hypertonicity 2/2 stomach sleeping.  Patient has had some relief with muscle relaxers.  Plan: 1. START tizanidine 4 mg q6h prn 2. Encouraged patient to sleep on back or side with proper pillow support. 3. Consider future physical therapy referral.  Patient declines today. 4. Encourage self-massage, professional massage, use of heat for muscle release 5. Follow-up 2-4 weeks prn   Meds ordered this encounter  Medications  . tiZANidine (ZANAFLEX) 4 MG tablet    Sig: Take 1 tablet (4 mg total) by mouth every 6 (six) hours as needed for muscle spasms (neck pain).    Dispense:  30 tablet    Refill:  1    Order Specific Question:   Supervising Provider    Answer:   Smitty CordsKARAMALEGOS, ALEXANDER J [[id number]]  . valsartan (DIOVAN) 160 MG tablet    Sig: Take 1 tablet (160 mg total) by mouth daily.    Dispense:  90 tablet    Refill:  3    Order Specific Question:   Supervising Provider    Answer:   Smitty CordsKARAMALEGOS, ALEXANDER J [[id number]]  . omeprazole (PRILOSEC) 20 MG capsule    Sig: Take 1 capsule (20 mg total) by mouth daily.    Dispense:  90 capsule    Refill:  1    Order Specific Question:   Supervising Provider    Answer:   Smitty CordsKARAMALEGOS, ALEXANDER J [[id number]]   Follow up plan: Return in about 6 months (around 03/15/2019) for hypertension.  Wilhelmina McardleLauren Dartanion Teo, DNP, AGPCNP-BC Adult Gerontology Primary Care Nurse Practitioner [facility] 09/12/2018, 4:22 PM

## 2018-09-12 NOTE — Patient Instructions (Addendum)
Tyler Hinton,   Thank you for coming in to clinic today.  1. STOP baclofen - START tizanidine 4 mg up to twice daily as needed for tension headache.  2. Avoid stomach sleeping to help your neck pain.    3. For neck pain, consider massage or trigger point release tool to help release your muscles.  We can also order physical therapy to help with the muscle imbalance/stretching/release.  Let me know if you want this physical therapy referral in the next 3 weeks.  Also, consider heat therapy to your muscles of neck and shoulder. Also try two tennis balls or similar in a sock.  4. Continue valsartan for blood pressure.  Please schedule a follow-up appointment with Cassell Smiles, AGNP. Return in about 6 months (around 03/15/2019) for hypertension.  If you have any other questions or concerns, please feel free to call the clinic or send a message through [place]. You may also schedule an earlier appointment if necessary.  You will receive a survey after today's visit either digitally by e-mail or paper by [employer]. Your experiences and feedback matter to [place].  Please respond so we know how we are doing as we provide care for you.   Cassell Smiles, DNP, AGNP-BC Adult Gerontology Nurse Practitioner [place]

## 2018-09-15 ENCOUNTER — Encounter: Payer: Self-pay | Admitting: Nurse Practitioner

## 2018-10-28 ENCOUNTER — Other Ambulatory Visit: Payer: Self-pay | Admitting: Nurse Practitioner

## 2018-10-28 DIAGNOSIS — G44221 Chronic tension-type headache, intractable: Secondary | ICD-10-CM

## 2018-12-05 ENCOUNTER — Other Ambulatory Visit: Payer: Self-pay | Admitting: Nurse Practitioner

## 2018-12-05 DIAGNOSIS — G44221 Chronic tension-type headache, intractable: Secondary | ICD-10-CM

## 2019-02-27 ENCOUNTER — Other Ambulatory Visit: Payer: Self-pay | Admitting: Nurse Practitioner

## 2019-02-27 DIAGNOSIS — K219 Gastro-esophageal reflux disease without esophagitis: Secondary | ICD-10-CM

## 2019-03-01 ENCOUNTER — Other Ambulatory Visit: Payer: Self-pay | Admitting: Nurse Practitioner

## 2019-03-01 DIAGNOSIS — G44221 Chronic tension-type headache, intractable: Secondary | ICD-10-CM

## 2019-03-14 ENCOUNTER — Encounter: Payer: Self-pay | Admitting: Family Medicine

## 2019-03-14 ENCOUNTER — Other Ambulatory Visit: Payer: Self-pay

## 2019-03-14 ENCOUNTER — Ambulatory Visit (INDEPENDENT_AMBULATORY_CARE_PROVIDER_SITE_OTHER): Payer: 59 | Admitting: Family Medicine

## 2019-03-14 DIAGNOSIS — M542 Cervicalgia: Secondary | ICD-10-CM | POA: Diagnosis not present

## 2019-03-14 DIAGNOSIS — I10 Essential (primary) hypertension: Secondary | ICD-10-CM

## 2019-03-14 DIAGNOSIS — G8929 Other chronic pain: Secondary | ICD-10-CM | POA: Diagnosis not present

## 2019-03-14 DIAGNOSIS — G44209 Tension-type headache, unspecified, not intractable: Secondary | ICD-10-CM | POA: Diagnosis not present

## 2019-03-14 MED ORDER — TIZANIDINE HCL 4 MG PO TABS: 4.0000 mg | ORAL_TABLET | Freq: Every evening | ORAL | 3 refills | Status: DC | PRN

## 2019-03-14 MED ORDER — VALSARTAN 160 MG PO TABS: 160.0000 mg | ORAL_TABLET | Freq: Every day | ORAL | 2 refills | Status: DC

## 2019-03-14 NOTE — Progress Notes (Signed)
Virtual Visit via Telephone The purpose of this virtual visit is to provide medical care while limiting exposure to the novel coronavirus (COVID19) for both patient and office staff.  Consent was obtained for phone visit:  Yes.   Answered questions that patient had about telehealth interaction:  Yes.   I discussed the limitations, risks, security and privacy concerns of performing an evaluation and management service by telephone. I also discussed with the patient that there may be a patient responsible charge related to this service. The patient expressed understanding and agreed to proceed.  Patient Location: Home Provider Location: [facility])  ---------------------------------------------------------------------- Chief Complaint  Patient presents with  . Hypertension    S: Reviewed CMA documentation. I have called patient and gathered additional HPI as follows:  Previous PCP Cassell Smiles, AGPCNP-BC   CHRONIC HTN: Reports chronic history HTN 2-3 years in past, previously had been in Pre-HTN range, in past he was started on BP medication with Lisinopril but developed ACEi cough reaction, then was switched to Valsartan and is doing well. - Today no new concerns with current med. He does not check BP currently but admits he could check BP if needed has cuff. Current Meds - Valsartan 160mg  daily Reports good compliance, took meds today. Tolerating well, w/o complaints. History of some headaches, none related to BP now Denies CP, dyspnea, edema, dizziness / lightheadedness  Neck Muscle Spasms / Tension headaches Chronic issue for him, he has been managed in past. Last seen prior PCP 08/2018 treated with Tizanidine 4mg  nightly PRN with some relief, he would wake up in AM with headache, he attributes this to chronic wear and tear and activity also thought it was sleep related. Off med needs new rx now  Denies any high risk travel to areas of current concern for  COVID19. Denies any known or suspected exposure to person with or possibly with COVID19.  Denies any fevers, chills, sweats, body ache, cough, shortness of breath, sinus pain or pressure, headache, abdominal pain, diarrhea  Past Medical History:  Diagnosis Date  . GERD (gastroesophageal reflux disease)   . Headache   . Hypertension   . Plantar fasciitis    in past   Social History   Tobacco Use  . Smoking status: Never Smoker  . Smokeless tobacco: Never Used  Substance Use Topics  . Alcohol use: No    Alcohol/week: 0.0 standard drinks    Comment: former drinker  . Drug use: No    Current Outpatient Medications:  .  ibuprofen (ADVIL,MOTRIN) 200 MG tablet, Take 200 mg by mouth every 6 (six) hours as needed., Disp: , Rfl:  .  omeprazole (PRILOSEC) 20 MG capsule, TAKE 1 CAPSULE BY MOUTH EVERY DAY, Disp: 90 capsule, Rfl: 1 .  tiZANidine (ZANAFLEX) 4 MG tablet, Take 1 tablet (4 mg total) by mouth at bedtime as needed for muscle spasms (neck pain)., Disp: 30 tablet, Rfl: 3 .  valsartan (DIOVAN) 160 MG tablet, Take 1 tablet (160 mg total) by mouth daily., Disp: 60 tablet, Rfl: 2  Depression screen [facility] 2/9 03/14/2019 01/06/2018 10/29/2016  Decreased Interest 0 0 0  Down, Depressed, Hopeless 0 0 0  PHQ - 2 Score 0 0 0    No flowsheet data found.  -------------------------------------------------------------------------- O: No physical exam performed due to remote telephone encounter.  Lab results reviewed.  No results found for this or any previous visit (from the past 2160 hour(s)).  -------------------------------------------------------------------------- A&P:  Problem List Items Addressed This Visit  Essential hypertension   Relevant Medications   valsartan (DIOVAN) 160 MG tablet  Controlled HTN No recent home readings, has cuff  No known complications  ACEi cough intolerance to lisinopril   Plan:  1. Continue current BP regimen - refilled Valsartan 160mg   daily 2. Encourage improved lifestyle - low sodium diet, regular exercise 3. Start monitor BP outside office, bring readings to next visit, if persistently >140/90 or new symptoms notify office sooner 4. Follow-up 6 months      Other Visit Diagnoses    Neck pain, chronic       Relevant Medications   tiZANidine (ZANAFLEX) 4 MG tablet   Tension headache       Relevant Medications   tiZANidine (ZANAFLEX) 4 MG tablet    Consistent with chronic neck muscle spasms vs tension headache, seems worse in AM after sleeping / chronic wear and tear and physical work can contribute - Improved on Tizanidine QHS PRN in past - Agree to refill Tizanidine 4mg  nightly PRN   Meds ordered this encounter  Medications  . valsartan (DIOVAN) 160 MG tablet    Sig: Take 1 tablet (160 mg total) by mouth daily.    Dispense:  60 tablet    Refill:  2    Patient requested 2 month supply per insurance. Keep refills on file until patient ready.  tiZANidine (ZANAFLEX) 4 MG tablet    Sig: Take 1 tablet (4 mg total) by mouth at bedtime as needed for muscle spasms (neck pain).    Dispense:  30 tablet    Refill:  3    Follow-up: - Return in 6 months for HTN / neck spasm headaches w/ new provider  Patient verbalizes understanding with the above medical recommendations including the limitation of remote medical advice.  Specific follow-up and call-back criteria were given for patient to follow-up or seek medical care more urgently if needed.   - Time spent in direct consultation with patient on phone: 8 minutes   , DO [facility] 03/14/2019, 3:53 PM

## 2019-03-14 NOTE — Patient Instructions (Addendum)
Refilled Valsartan 60 day supply with refills Refilled Tizanidine nightly as needed for neck pain muscle spasm 30 day  Please schedule a Follow-up Appointment to: Return in about 6 months (around 09/11/2019) for 6 month HTN / neck spasm w/ new provider.  If you have any other questions or concerns, please feel free to call the office or send a message through MyChart. You may also schedule an earlier appointment if necessary.  Additionally, you may be receiving a survey about your experience at our office within a few days to 1 week by e-mail or mail. We value your feedback.  Saralyn Pilar, DO [facility], [place]

## 2019-03-16 ENCOUNTER — Ambulatory Visit: Payer: Self-pay | Admitting: Nurse Practitioner

## 2019-07-24 ENCOUNTER — Other Ambulatory Visit: Payer: Self-pay | Admitting: Family Medicine

## 2019-07-24 DIAGNOSIS — G44221 Chronic tension-type headache, intractable: Secondary | ICD-10-CM

## 2019-11-26 ENCOUNTER — Other Ambulatory Visit: Payer: Self-pay | Admitting: Family Medicine

## 2019-11-26 DIAGNOSIS — K219 Gastro-esophageal reflux disease without esophagitis: Secondary | ICD-10-CM

## 2019-11-26 DIAGNOSIS — G44221 Chronic tension-type headache, intractable: Secondary | ICD-10-CM

## 2019-11-26 DIAGNOSIS — I10 Essential (primary) hypertension: Secondary | ICD-10-CM

## 2019-11-27 NOTE — Telephone Encounter (Signed)
Refill requests for Omeprazole filled 09/25/19, Valsartan filled 09/22/19, and Sumatriptan 06/24/19; no valid encounter in last 6 months; no upcoming appt noted; attempted to contact pt; left message on voicemail. 30 day courtesy refill for Valsartan granted; visit needed for additional refills.  Requested medication (s) are due for refill today: Sumatriptan,yes  Requested medication (s) are on the active medication list: no  Last refill: ?  Future visit scheduled: no  Notes to clinic:d/c'd 03/14/19      Requested Prescriptions  Pending Prescriptions Disp Refills   valsartan (DIOVAN) 160 MG tablet [Pharmacy Med Name: VALSARTAN 160 MG TABLET] 90 tablet 1    Sig: TAKE 1 TABLET BY MOUTH EVERY DAY     Cardiovascular:  Angiotensin Receptor Blockers Failed - 11/26/2019 10:32 PM      Failed - Cr in normal range and within 180 days    Creat  Date Value Ref Range Status  03/29/2017 0.87 0.60 - 1.35 mg/dL Final         Failed - K in normal range and within 180 days    Potassium  Date Value Ref Range Status  03/29/2017 4.5 3.5 - 5.3 mmol/L Final         Failed - Valid encounter within last 6 months    Recent Outpatient Visits          8 months ago Essential hypertension   [facility] Smitty Cords, DO   1 year ago Neck pain, chronic   [facility] Kyung Rudd, Alison Stalling, NP   1 year ago Essential hypertension   [facility] Kyung Rudd, Alison Stalling, NP   2 years ago Gastroesophageal reflux disease, esophagitis presence not specified   [facility] Kyung Rudd, Alison Stalling, NP   2 years ago Chronic tension-type headache, not intractable   [facility] Kyung Rudd, Alison Stalling, NP             Passed - Patient is not pregnant      Passed - Last BP in normal range    BP Readings from Last 1 Encounters:  09/12/18 129/78          omeprazole (PRILOSEC) 20 MG capsule [Pharmacy Med Name: OMEPRAZOLE DR 20  MG CAPSULE] 90 capsule 1    Sig: TAKE 1 CAPSULE BY MOUTH EVERY DAY     Gastroenterology: Proton Pump Inhibitors Passed - 11/26/2019 10:32 PM      Passed - Valid encounter within last 12 months    Recent Outpatient Visits          8 months ago Essential hypertension   [facility] [place], Netta Neat, DO   1 year ago Neck pain, chronic   [facility] Kyung Rudd, Alison Stalling, NP   1 year ago Essential hypertension   [facility] Kyung Rudd, Alison Stalling, NP   2 years ago Gastroesophageal reflux disease, esophagitis presence not specified   [facility] Galen Manila, NP   2 years ago Chronic tension-type headache, not intractable   [facility] Kyung Rudd, Alison Stalling, NP              SUMAtriptan (IMITREX) 50 MG tablet [Pharmacy Med Name: SUMATRIPTAN SUCC 50 MG TABLET] 9 tablet 2    Sig: TAKE 1 TABLET (50 MG TOTAL) BY MOUTH DAILY AS NEEDED FOR HEADACHE.     Neurology:  Migraine Therapy - Triptan Passed - 11/26/2019 10:32 PM  Passed - Last BP in normal range    BP Readings from Last 1 Encounters:  09/12/18 129/78         Passed - Valid encounter within last 12 months    Recent Outpatient Visits          8 months ago Essential hypertension   [facility] Smitty Cords, DO   1 year ago Neck pain, chronic   [facility] Kyung Rudd, Alison Stalling, NP   1 year ago Essential hypertension   [facility] Galen Manila, NP   2 years ago Gastroesophageal reflux disease, esophagitis presence not specified   [facility] Galen Manila, NP   2 years ago Chronic tension-type headache, not intractable   [facility] Kyung Rudd, Alison Stalling, NP

## 2020-01-02 ENCOUNTER — Other Ambulatory Visit: Payer: Self-pay | Admitting: Family Medicine

## 2020-01-02 DIAGNOSIS — G8929 Other chronic pain: Secondary | ICD-10-CM

## 2020-01-02 DIAGNOSIS — M542 Cervicalgia: Secondary | ICD-10-CM

## 2020-01-02 DIAGNOSIS — G44209 Tension-type headache, unspecified, not intractable: Secondary | ICD-10-CM

## 2020-01-02 NOTE — Telephone Encounter (Signed)
Requested medications are due for refill today yes  Requested medications are on the active medication list yes  Last refill 10/10  Last visit Jan 2021  Future visit scheduled no  Notes to clinic Not Delegated

## 2020-02-27 ENCOUNTER — Other Ambulatory Visit: Payer: Self-pay | Admitting: Family Medicine

## 2020-02-27 DIAGNOSIS — K219 Gastro-esophageal reflux disease without esophagitis: Secondary | ICD-10-CM

## 2020-03-21 ENCOUNTER — Other Ambulatory Visit: Payer: Self-pay | Admitting: Family Medicine

## 2020-03-21 DIAGNOSIS — K219 Gastro-esophageal reflux disease without esophagitis: Secondary | ICD-10-CM

## 2022-01-22 ENCOUNTER — Ambulatory Visit: Payer: Managed Care, Other (non HMO) | Admitting: Internal Medicine

## 2022-01-22 ENCOUNTER — Encounter: Payer: Self-pay | Admitting: Internal Medicine

## 2022-01-22 VITALS — BP 137/84 | HR 79 | Temp 96.9°F | Ht 70.0 in | Wt 259.0 lb

## 2022-01-22 DIAGNOSIS — I10 Essential (primary) hypertension: Secondary | ICD-10-CM

## 2022-01-22 DIAGNOSIS — E7800 Pure hypercholesterolemia, unspecified: Secondary | ICD-10-CM

## 2022-01-22 DIAGNOSIS — K219 Gastro-esophageal reflux disease without esophagitis: Secondary | ICD-10-CM | POA: Diagnosis not present

## 2022-01-22 DIAGNOSIS — Z1159 Encounter for screening for other viral diseases: Secondary | ICD-10-CM

## 2022-01-22 DIAGNOSIS — R739 Hyperglycemia, unspecified: Secondary | ICD-10-CM

## 2022-01-22 DIAGNOSIS — N400 Enlarged prostate without lower urinary tract symptoms: Secondary | ICD-10-CM | POA: Insufficient documentation

## 2022-01-22 DIAGNOSIS — Z6837 Body mass index (BMI) 37.0-37.9, adult: Secondary | ICD-10-CM

## 2022-01-22 DIAGNOSIS — R3911 Hesitancy of micturition: Secondary | ICD-10-CM

## 2022-01-22 DIAGNOSIS — R7309 Other abnormal glucose: Secondary | ICD-10-CM

## 2022-01-22 DIAGNOSIS — G44229 Chronic tension-type headache, not intractable: Secondary | ICD-10-CM

## 2022-01-22 DIAGNOSIS — Z1211 Encounter for screening for malignant neoplasm of colon: Secondary | ICD-10-CM

## 2022-01-22 DIAGNOSIS — E66812 Obesity, class 2: Secondary | ICD-10-CM

## 2022-01-22 DIAGNOSIS — N401 Enlarged prostate with lower urinary tract symptoms: Secondary | ICD-10-CM

## 2022-01-22 MED ORDER — VALSARTAN 80 MG PO TABS: 80.0000 mg | ORAL_TABLET | Freq: Every day | ORAL | 1 refills | Status: DC

## 2022-01-22 NOTE — Addendum Note (Signed)
Addended by: Kavin Leech E on: 01/22/2022 04:07 PM   Modules accepted: Orders

## 2022-01-22 NOTE — Assessment & Plan Note (Signed)
Uncontrolled off meds We will restart valsartan 80 mg daily Reinforced DASH diet and exercise for weight loss

## 2022-01-22 NOTE — Assessment & Plan Note (Signed)
Encourage weight loss as this can help reduce reflux symptoms Avoid foods that trigger reflux Continue omeprazole 

## 2022-01-22 NOTE — Progress Notes (Signed)
Subjective:    Patient ID: Tyler Hinton, male    DOB: 1975-12-11, 46 y.o.   MRN: [id number]  HPI  Patient presents to clinic today for follow-up of chronic conditions.  Frequent Headaches: These occur rarely.  He is not sure what triggered this.  He takes Ibuprofen as needed with good relief of symptoms.  He does not follow with neurology.  GERD: Triggered by tomato based foods and eating and laying down.  He denies breakthrough on Omeprazole.  There is no upper GI on file.  HTN: His BP today is 137/84.  He is not taking Valsartan as prescribed.  There is no ECG on file.  HLD: His last LDL was 118, triglycerides 112, 2019.  He is not taking any cholesterol-lowering medication at this time.  He does not consume a low-fat diet.  BPH: He reports urinary hesitancy. He is taking Flomax as prescribed. He does not follow with urology.  Review of Systems     Past Medical History:  Diagnosis Date   GERD (gastroesophageal reflux disease)    Headache    Hypertension    Plantar fasciitis    in past    Current Outpatient Medications  Medication Sig Dispense Refill   ibuprofen (ADVIL,MOTRIN) 200 MG tablet Take 200 mg by mouth every 6 (six) hours as needed.     omeprazole (PRILOSEC) 20 MG capsule TAKE 1 CAPSULE BY MOUTH EVERY DAY 30 capsule 0   tiZANidine (ZANAFLEX) 4 MG tablet Take 1 tablet (4 mg total) by mouth at bedtime as needed for muscle spasms (neck pain). 30 tablet 3   valsartan (DIOVAN) 160 MG tablet TAKE 1 TABLET BY MOUTH EVERY DAY 30 tablet 0   No current facility-administered medications for this visit.    Allergies  Allergen Reactions   Aspirin Hives   Lisinopril Cough    Family History  Problem Relation Age of Onset   Cancer Maternal Grandmother    Diabetes Maternal Grandmother    Cancer Maternal Grandfather    Diabetes Maternal Grandfather    Colon cancer Maternal Aunt     Social History   Socioeconomic History   Marital status: Married    Spouse name: Not  on file   Number of children: Not on file   Years of education: Not on file   Highest education level: Not on file  Occupational History   Not on file  Tobacco Use   Smoking status: Never   Smokeless tobacco: Never  Vaping Use   Vaping Use: Never used  Substance and Sexual Activity   Alcohol use: No    Alcohol/week: 0.0 standard drinks of alcohol    Comment: former drinker   Drug use: No   Sexual activity: Not on file  Other Topics Concern   Not on file  Social History Narrative   Not on file   Social Determinants of Health   Financial Resource Strain: Not on file  Food Insecurity: Not on file  Transportation Needs: Not on file  Physical Activity: Not on file  Stress: Not on file  Social Connections: Not on file  Intimate Partner Violence: Not on file     Constitutional: Patient reports intermittent headaches.  Denies fever, malaise, fatigue, or abrupt weight changes.  HEENT: Denies eye pain, eye redness, ear pain, ringing in the ears, wax buildup, runny nose, nasal congestion, bloody nose, or sore throat. Respiratory: Denies difficulty breathing, shortness of breath, cough or sputum production.   Cardiovascular: Denies chest pain, chest tightness,  palpitations or swelling in the hands or feet.  Gastrointestinal: Denies abdominal pain, bloating, constipation, diarrhea or blood in the stool.  GU: Pt reports urinary hesitancy. Denies urgency, frequency, pain with urination, burning sensation, blood in urine, odor or discharge. Musculoskeletal: Denies decrease in range of motion, difficulty with gait, muscle pain or joint pain and swelling.  Skin: Denies redness, rashes, lesions or ulcercations.  Neurological: Denies dizziness, difficulty with memory, difficulty with speech or problems with balance and coordination.  Psych: Denies anxiety, depression, SI/HI.  No other specific complaints in a complete review of systems (except as listed in HPI above).  Objective:    Physical Exam BP 137/84 (BP Location: Right Arm, Patient Position: Sitting, Cuff Size: Normal)   Pulse 79   Temp (!) 96.9 F (36.1 C) (Temporal)   Ht 5\' 10"  (1.778 m)   Wt 259 lb (117.5 kg)   SpO2 96%   BMI 37.16 kg/m   Wt Readings from Last 3 Encounters:  09/12/18 275 lb 3.2 oz (124.8 kg)  12/30/17 271 lb 6.4 oz (123.1 kg)  09/30/17 267 lb 9.6 oz (121.4 kg)    General: Appears their stated age, obese, in NAD. Skin: Warm, dry and intact. HEENT: Head: normal shape and size; Eyes: sclera white, no icterus, conjunctiva pink, PERRLA and EOMs intact;  Cardiovascular: Normal rate and rhythm. S1,S2 noted.  No murmur, rubs or gallops noted. No JVD or BLE edema.  Pulmonary/Chest: Normal effort and positive vesicular breath sounds. No respiratory distress. No wheezes, rales or ronchi noted.  Abdomen: Normal bowel sounds.  Musculoskeletal: No difficulty with gait.  Neurological: Alert and oriented.  Coordination normal.      BMET    Component Value Date/Time   NA 142 03/29/2017 0949   K 4.5 03/29/2017 0949   CL 107 03/29/2017 0949   CO2 28 03/29/2017 0949   GLUCOSE 89 03/29/2017 0949   BUN 10 03/29/2017 0949   CREATININE 0.87 03/29/2017 0949   CALCIUM 9.5 03/29/2017 0949    Lipid Panel     Component Value Date/Time   CHOL 183 03/29/2017 0949   TRIG 112 03/29/2017 0949   HDL 43 03/29/2017 0949   CHOLHDL 4.3 03/29/2017 0949   LDLCALC 118 (H) 03/29/2017 0949    CBC    Component Value Date/Time   WBC 6.5 03/29/2017 0949   RBC 4.90 03/29/2017 0949   HGB 14.7 03/29/2017 0949   HCT 42.8 03/29/2017 0949   PLT 314 03/29/2017 0949   MCV 87.3 03/29/2017 0949   MCH 30.0 03/29/2017 0949   MCHC 34.3 03/29/2017 0949   RDW 12.8 03/29/2017 0949   LYMPHSABS 2,139 03/29/2017 0949   EOSABS 163 03/29/2017 0949   BASOSABS 39 03/29/2017 0949    Hgb A1C Lab Results  Component Value Date   HGBA1C 5.5 03/29/2017            Assessment & Plan:      RTC in 6 months for  your annual exam 05/27/2017, NP

## 2022-01-22 NOTE — Assessment & Plan Note (Signed)
Continue ibuprofen as needed but avoid overuse

## 2022-01-22 NOTE — Assessment & Plan Note (Signed)
Encouraged diet and exercise for weight loss ?

## 2022-01-22 NOTE — Assessment & Plan Note (Signed)
-   Continue Flomax 

## 2022-01-22 NOTE — Assessment & Plan Note (Signed)
C-Met and lipid profile today Encouraged to consume a low-fat diet 

## 2022-01-22 NOTE — Patient Instructions (Signed)

## 2022-03-04 LAB — COLOGUARD: COLOGUARD: NEGATIVE

## 2022-04-09 ENCOUNTER — Encounter: Payer: Self-pay | Admitting: Internal Medicine

## 2022-04-09 ENCOUNTER — Ambulatory Visit
Admission: RE | Admit: 2022-04-09 | Discharge: 2022-04-09 | Disposition: A | Discharge status: Home / Self Care | Payer: Managed Care, Other (non HMO) | Attending: Internal Medicine | Admitting: Internal Medicine

## 2022-04-09 ENCOUNTER — Ambulatory Visit: Payer: Managed Care, Other (non HMO) | Admitting: Internal Medicine

## 2022-04-09 ENCOUNTER — Ambulatory Visit
Admission: RE | Admit: 2022-04-09 | Discharge: 2022-04-09 | Disposition: A | Discharge status: Home / Self Care | Payer: Managed Care, Other (non HMO) | Source: Ambulatory Visit | Attending: Internal Medicine | Admitting: Internal Medicine

## 2022-04-09 VITALS — BP 128/80 | HR 68 | Temp 96.9°F | Wt 255.0 lb

## 2022-04-09 DIAGNOSIS — G8929 Other chronic pain: Secondary | ICD-10-CM | POA: Diagnosis not present

## 2022-04-09 DIAGNOSIS — R202 Paresthesia of skin: Secondary | ICD-10-CM | POA: Insufficient documentation

## 2022-04-09 DIAGNOSIS — M542 Cervicalgia: Secondary | ICD-10-CM | POA: Insufficient documentation

## 2022-04-09 DIAGNOSIS — M7701 Medial epicondylitis, right elbow: Secondary | ICD-10-CM

## 2022-04-09 MED ORDER — METHOCARBAMOL 500 MG PO TABS: 500.0000 mg | ORAL_TABLET | Freq: Every evening | ORAL | 0 refills | Status: DC | PRN

## 2022-04-09 MED ORDER — PREDNISONE 10 MG PO TABS: ORAL_TABLET | ORAL | 0 refills | Status: DC

## 2022-04-09 NOTE — Patient Instructions (Signed)
Cervical Radiculopathy  Cervical radiculopathy means that a nerve in the neck (a cervical nerve) is pinched or bruised. This can happen because of an injury to the cervical spine (vertebrae) in the neck, or as a normal part of getting older. This condition can cause pain or loss of feeling (numbness) that runs from your neck all the way down to your arm and fingers. Often, this condition gets better with rest. Treatment may be needed if the condition does not get better. What are the causes? A neck injury. A bulging disk in your spine. Sudden muscle tightening (muscle spasms). Tight muscles in your neck due to overuse. Arthritis. Breakdown in the bones and joints of the spine (spondylosis) due to getting older. Bone spurs that form near the nerves in the neck. What are the signs or symptoms? Pain. The pain may: Run from the neck to the arm and hand. Be very bad or irritating. Get worse when you move your neck. Loss of feeling or tingling in your arm or hand. Weakness in your arm or hand, in very bad cases. How is this treated? In many cases, treatment is not needed for this condition. With rest, the condition often gets better over time. If treatment is needed, options may include: Wearing a soft neck collar (cervical collar) for short periods of time. Doing exercises (physical therapy) to strengthen your neck muscles. Taking medicines. Having shots (injections) in your spine, in very bad cases. Having surgery. This may be needed if other treatments do not help. The type of surgery that is used will depend on the cause of your condition. Follow these instructions at home: If you have a soft neck collar: Wear it as told by your doctor. Take it off only as told by your doctor. Ask your doctor if you can take the collar off for cleaning and bathing. If you are allowed to take the collar off for cleaning or bathing: Follow instructions from your doctor about how to take off the collar  safely. Clean the collar by wiping it with mild soap and water and drying it completely. Take out any removable pads in the collar every 1-2 days. Wash them by hand with soap and water. Let them air-dry completely before you put them back in the collar. Check your skin under the collar for redness or sores. If you see any, tell your doctor. Managing pain     Take over-the-counter and prescription medicines only as told by your doctor. If told, put ice on the painful area. To do this: If you have a soft neck collar, take if off as told by your doctor. Put ice in a plastic bag. Place a towel between your skin and the bag. Leave the ice on for 20 minutes, 2-3 times a day. Take off the ice if your skin turns bright red. This is very important. If you cannot feel pain, heat, or cold, you have a greater risk of damage to the area. If using ice does not help, you can try using heat. Use the heat source that your doctor recommends, such as a moist heat pack or a heating pad. Place a towel between your skin and the heat source. Leave the heat on for 20-30 minutes. Take off the heat if your skin turns bright red. This is very important. If you cannot feel pain, heat, or cold, you have a greater risk of getting burned. You may try a gentle neck and shoulder rub (massage). Activity Rest as needed. Return  to your normal activities when your doctor says that it is safe. Do exercises as told by your doctor or physical therapist. You may have to avoid lifting. Ask your doctor how much you can safely lift. General instructions Use a flat pillow when you sleep. Do not drive while wearing a soft neck collar. If you do not have a soft neck collar, ask your doctor if it is safe to drive while your neck heals. Ask your doctor if you should avoid driving or using machines while you are taking your medicine. Do not smoke or use any products that contain nicotine or tobacco. If you need help quitting, ask your  doctor. Keep all follow-up visits. Contact a doctor if: Your condition does not get better with treatment. Get help right away if: Your pain gets worse and medicine does not help. You lose feeling or feel weak in your hand, arm, face, or leg. You have a high fever. Your neck is stiff. You cannot control when you poop or pee (have incontinence). You have trouble with walking, balance, or talking. Summary Cervical radiculopathy means that a nerve in the neck is pinched or bruised. A nerve can get pinched from a bulging disk, arthritis, an injury to the neck, or other causes. Symptoms include pain, tingling, or loss of feeling that goes from the neck to the arm or hand. Weakness in your arm or hand can happen in very bad cases. Treatment may include resting, wearing a soft neck collar, and doing exercises. You might need to take medicines for pain. In very bad cases, shots or surgery may be needed. This information is not intended to replace advice given to you by your health care provider. Make sure you discuss any questions you have with your health care provider. Document Revised: 08/08/2020 Document Reviewed: 08/08/2020 Elsevier Patient Education  [place].

## 2022-04-09 NOTE — Progress Notes (Signed)
Subjective:    Patient ID: Tyler Hinton, male    DOB: 1975/03/24, 47 y.o.   MRN: [id number]  HPI  Patient presents to clinic today with complaint of neck pain.  This started 1 month ago.  He describes the pain as dull and burning. The pain can be worse with certain movements, mainly with sitting too long. He reports associated right upper back pain. He describes the pain as dull and burning. He reports intermittent headache, but denies dizziness, vision changes. He reports intermittent numbness, tingling and weakness of bilateral upper extremities. He has taken Ibuprofen OTC with minimal relief of symptoms. He denies any injury to the area.  He also reports right elbow pain.  This started 1 month ago.  He reports he noticed it when he lifted something heavy.  He describes the pain as a burning sensation mostly when he lifts anything with his right hand.  He reports associated weakness but denies numbness and tingling associated with the elbow pain.  He has tried Ibuprofen OTC with minimal relief of symptoms.  Review of Systems     Past Medical History:  Diagnosis Date   GERD (gastroesophageal reflux disease)    Headache    Hypertension    Plantar fasciitis    in past    Current Outpatient Medications  Medication Sig Dispense Refill   ibuprofen (ADVIL,MOTRIN) 200 MG tablet Take 200 mg by mouth every 6 (six) hours as needed.     omeprazole (PRILOSEC) 20 MG capsule TAKE 1 CAPSULE BY MOUTH EVERY DAY 30 capsule 0   tamsulosin (FLOMAX) 0.4 MG CAPS capsule Take by mouth daily.     valsartan (DIOVAN) 80 MG tablet Take 1 tablet (80 mg total) by mouth daily. 90 tablet 1   No current facility-administered medications for this visit.    Allergies  Allergen Reactions   Aspirin Hives   Lisinopril Cough    Family History  Problem Relation Age of Onset   Cancer Maternal Grandmother    Diabetes Maternal Grandmother    Cancer Maternal Grandfather    Diabetes Maternal Grandfather    Colon  cancer Maternal Aunt     Social History   Socioeconomic History   Marital status: Married    Spouse name: Not on file   Number of children: Not on file   Years of education: Not on file   Highest education level: Not on file  Occupational History   Not on file  Tobacco Use   Smoking status: Never   Smokeless tobacco: Never  Vaping Use   Vaping Use: Never used  Substance and Sexual Activity   Alcohol use: No    Alcohol/week: 0.0 standard drinks of alcohol    Comment: former drinker   Drug use: No   Sexual activity: Not on file  Other Topics Concern   Not on file  Social History Narrative   Not on file   Social Determinants of Health   Financial Resource Strain: Not on file  Food Insecurity: Not on file  Transportation Needs: Not on file  Physical Activity: Not on file  Stress: Not on file  Social Connections: Not on file  Intimate Partner Violence: Not on file     Constitutional: Pt reports intermittent headaches. Denies fever, malaise, fatigue, or abrupt weight changes.  HEENT: Denies eye pain, eye redness, ear pain, ringing in the ears, wax buildup, runny nose, nasal congestion, bloody nose, or sore throat. Respiratory: Denies difficulty breathing, shortness of breath, cough or sputum  production.   Cardiovascular: Denies chest pain, chest tightness, palpitations or swelling in the hands or feet.  Musculoskeletal: Patient reports neck pain, right upper back pain and right elbow pain.  Denies decrease in range of motion, difficulty with gait, or joint swelling.  Skin: Denies redness, rashes, lesions or ulcercations.  Neurological: Pt reports intermittent paresthesias of bilateral upper extremities. Denies dizziness, difficulty with memory, difficulty with speech or problems with balance and coordination.  Psych: Denies anxiety, depression, SI/HI.  No other specific complaints in a complete review of systems (except as listed in HPI above).  Objective:   Physical  Exam   BP 128/80 (BP Location: Left Arm, Patient Position: Sitting, Cuff Size: Large)   Pulse 68   Temp (!) 96.9 F (36.1 C) (Temporal)   Wt 255 lb (115.7 kg)   SpO2 97%   BMI 36.59 kg/m   Wt Readings from Last 3 Encounters:  01/22/22 259 lb (117.5 kg)  09/12/18 275 lb 3.2 oz (124.8 kg)  12/30/17 271 lb 6.4 oz (123.1 kg)    General: Appears his stated age, obese, in NAD. Cardiovascular: Normal rate and rhythm. S1,S2 noted.  No murmur, rubs or gallops noted.  Radial pulse 2+ on the right. Pulmonary/Chest: Normal effort and positive vesicular breath sounds. No respiratory distress. No wheezes, rales or ronchi noted.  Musculoskeletal: Normal flexion, extension, rotation and lateral bending of the cervical spine.  No bony tenderness noted over the cervical spine.  Normal internal, external rotation of the right shoulder.  Negative drop can test on the right.  Normal flexion, extension and rotation of the right elbow.  Pain with palpation over the medial epicondyle.  Shoulder shrug equal.  Strength 5/5 BUE.  Handgrips equal. Neurological: Alert and oriented. Coordination normal.     BMET    Component Value Date/Time   NA 142 03/29/2017 0949   K 4.5 03/29/2017 0949   CL 107 03/29/2017 0949   CO2 28 03/29/2017 0949   GLUCOSE 89 03/29/2017 0949   BUN 10 03/29/2017 0949   CREATININE 0.87 03/29/2017 0949   CALCIUM 9.5 03/29/2017 0949    Lipid Panel     Component Value Date/Time   CHOL 183 03/29/2017 0949   TRIG 112 03/29/2017 0949   HDL 43 03/29/2017 0949   CHOLHDL 4.3 03/29/2017 0949   LDLCALC 118 (H) 03/29/2017 0949    CBC    Component Value Date/Time   WBC 6.5 03/29/2017 0949   RBC 4.90 03/29/2017 0949   HGB 14.7 03/29/2017 0949   HCT 42.8 03/29/2017 0949   PLT 314 03/29/2017 0949   MCV 87.3 03/29/2017 0949   MCH 30.0 03/29/2017 0949   MCHC 34.3 03/29/2017 0949   RDW 12.8 03/29/2017 0949   LYMPHSABS 2,139 03/29/2017 0949   EOSABS 163 03/29/2017 0949   BASOSABS  39 03/29/2017 0949    Hgb A1C Lab Results  Component Value Date   HGBA1C 5.5 03/29/2017           Assessment & Plan:   Chronic Neck Pain, Paresthesia of Upper Extremity:  X-ray cervical spine today Rx for Pred taper x 6 days Rx for Methocarbamol 500 mg nightly-sedation caution given Discussed further evaluation with PT versus MRI  Medial Epicondylitis, Right Elbow:  Avoid repetitive motion No indication for x-ray of the elbow at this time Rx for Pred taper x 6 days 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

## 2022-06-01 ENCOUNTER — Other Ambulatory Visit: Payer: Self-pay

## 2022-06-01 ENCOUNTER — Other Ambulatory Visit: Payer: Self-pay | Admitting: Internal Medicine

## 2022-06-01 MED ORDER — TAMSULOSIN HCL 0.4 MG PO CAPS
0.4000 mg | ORAL_CAPSULE | Freq: Every day | ORAL | 1 refills | Status: DC
  Filled 2022-06-01: qty 90, 90d supply, fill #0
  Filled 2022-09-14: qty 90, 90d supply, fill #1

## 2022-09-14 ENCOUNTER — Other Ambulatory Visit: Payer: Self-pay

## 2022-10-16 ENCOUNTER — Encounter: Payer: Self-pay | Admitting: Internal Medicine

## 2022-10-16 ENCOUNTER — Ambulatory Visit: Payer: Managed Care, Other (non HMO) | Admitting: Internal Medicine

## 2022-10-16 VITALS — BP 154/102 | HR 74 | Temp 96.0°F | Wt 253.0 lb

## 2022-10-16 DIAGNOSIS — R030 Elevated blood-pressure reading, without diagnosis of hypertension: Secondary | ICD-10-CM

## 2022-10-16 DIAGNOSIS — G8929 Other chronic pain: Secondary | ICD-10-CM

## 2022-10-16 DIAGNOSIS — M542 Cervicalgia: Secondary | ICD-10-CM | POA: Diagnosis not present

## 2022-10-16 DIAGNOSIS — M25512 Pain in left shoulder: Secondary | ICD-10-CM | POA: Diagnosis not present

## 2022-10-16 MED ORDER — PREDNISONE 10 MG PO TABS: ORAL_TABLET | ORAL | 0 refills | Status: DC

## 2022-10-16 MED ORDER — CYCLOBENZAPRINE HCL 10 MG PO TABS
10.0000 mg | ORAL_TABLET | Freq: Three times a day (TID) | ORAL | 0 refills | Status: AC | PRN
Start: 2022-10-16 — End: ?

## 2022-10-16 NOTE — Patient Instructions (Signed)

## 2022-10-16 NOTE — Progress Notes (Signed)
Subjective:    Patient ID: Tyler Hinton, male    DOB: [birth year], 47 y.o.   MRN: [id number]  HPI  Patient presents to clinic today with complaint of chronic neck and left shoulder pain.  This started about 8 months ago.  He describes the pain as dull and achy. He feels like he is having restricted range of motion. He has denies numbness, tingling or weakness of the upper extremities.  X-ray cervical spine from 03/2022 showed:  IMPRESSION: Mild disc space height loss at C3-C4 moderate disc space height loss at C6-C7.  He describes the shoulder pain as dull and achy but can be sharp with certain movements. This pain started 3 months ago. He denies any injury/surgical intervention to the neck or shoulder. He takes methocarbamol and ibuprofen with minimal relief of symptoms.  Of note, his BP today is 154/98.  He has no history of hypertension but has had 1 prior elevated reading of 146/92.  He is not taking any antihypertensive medications at this time.  Review of Systems     Past Medical History:  Diagnosis Date   GERD (gastroesophageal reflux disease)    Headache    Hypertension    Plantar fasciitis    in past    Current Outpatient Medications  Medication Sig Dispense Refill   ibuprofen (ADVIL,MOTRIN) 200 MG tablet Take 200 mg by mouth every 6 (six) hours as needed.     methocarbamol (ROBAXIN) 500 MG tablet Take 1 tablet (500 mg total) by mouth at bedtime as needed for muscle spasms. 30 tablet 0   omeprazole (PRILOSEC) 20 MG capsule TAKE 1 CAPSULE BY MOUTH EVERY DAY 30 capsule 0   predniSONE (DELTASONE) 10 MG tablet Take 6 tabs on day 1, 5 tabs on day 2, 4 tabs on day 3, 3 tabs on day 4, 2 tabs on day 5, 1 tab on day 6 21 tablet 0   tamsulosin (FLOMAX) 0.4 MG CAPS capsule Take 1 capsule (0.4 mg total) by mouth daily. 90 capsule 1   valsartan (DIOVAN) 80 MG tablet Take 1 tablet (80 mg total) by mouth daily. 90 tablet 1   No current facility-administered medications for this visit.     Allergies  Allergen Reactions   Aspirin Hives   Lisinopril Cough    Family History  Problem Relation Age of Onset   Cancer Maternal Grandmother    Diabetes Maternal Grandmother    Cancer Maternal Grandfather    Diabetes Maternal Grandfather    Colon cancer Maternal Aunt     Social History   Socioeconomic History   Marital status: Married    Spouse name: Not on file   Number of children: Not on file   Years of education: Not on file   Highest education level: Not on file  Occupational History   Not on file  Tobacco Use   Smoking status: Never   Smokeless tobacco: Never  Vaping Use   Vaping status: Never Used  Substance and Sexual Activity   Alcohol use: No    Alcohol/week: 0.0 standard drinks of alcohol    Comment: former drinker   Drug use: No   Sexual activity: Not on file  Other Topics Concern   Not on file  Social History Narrative   Not on file   Social Determinants of Health   Financial Resource Strain: Not on file  Food Insecurity: Not on file  Transportation Needs: Not on file  Physical Activity: Not on file  Stress: Not  on file  Social Connections: Not on file  Intimate Partner Violence: Not on file     Constitutional: Patient reports intermittent headaches.  Denies fever, malaise, fatigue, or abrupt weight changes.  HEENT: Denies eye pain, eye redness, ear pain, ringing in the ears, wax buildup, runny nose, nasal congestion, bloody nose, or sore throat. Respiratory: Denies difficulty breathing, shortness of breath, cough or sputum production.   Cardiovascular: Denies chest pain, chest tightness, palpitations or swelling in the hands or feet.  Gastrointestinal: Denies abdominal pain, bloating, constipation, diarrhea or blood in the stool.  GU: Denies urgency, frequency, pain with urination, burning sensation, blood in urine, odor or discharge. Musculoskeletal: Patient reports chronic neck and left shoulder pain.  Denies decrease in range of  motion, difficulty with gait, muscle pain or joint swelling.  Skin: Denies redness, rashes, lesions or ulcercations.  Neurological: Denies dizziness, difficulty with memory, difficulty with speech or problems with balance and coordination.  Psych: Denies anxiety, depression, SI/HI.  No other specific complaints in a complete review of systems (except as listed in HPI above).  Objective:   Physical Exam  BP (!) 154/102 (BP Location: Left Arm, Patient Position: Sitting, Cuff Size: Large)   Pulse 74   Temp (!) 96 F (35.6 C) (Temporal)   Wt 253 lb (114.8 kg)   SpO2 95%   BMI 36.30 kg/m   Wt Readings from Last 3 Encounters:  04/09/22 255 lb (115.7 kg)  01/22/22 259 lb (117.5 kg)  09/12/18 275 lb 3.2 oz (124.8 kg)    General: Appears his stated age, obese, in NAD. Cardiovascular: Normal rate and rhythm. S1,S2 noted.  No murmur, rubs or gallops noted.  Pulmonary/Chest: Normal effort and positive vesicular breath sounds. No respiratory distress. No wheezes, rales or ronchi noted.  Musculoskeletal: Normal flexion, extension, rotation of the cervical spine.  Decreased lateral bending bilaterally.  No bony tenderness noted over the cervical spine or paracervical muscles.  Normal internal and external rotation of the left shoulder.  Negative drop can test on left.  Shoulder shrug equal.  Strength 5/5 BUE.  Handgrips equal.  No difficulty with gait.  Neurological: Alert and oriented. Coordination normal.      BMET    Component Value Date/Time   NA 142 03/29/2017 0949   K 4.5 03/29/2017 0949   CL 107 03/29/2017 0949   CO2 28 03/29/2017 0949   GLUCOSE 89 03/29/2017 0949   BUN 10 03/29/2017 0949   CREATININE 0.87 03/29/2017 0949   CALCIUM 9.5 03/29/2017 0949    Lipid Panel     Component Value Date/Time   CHOL 183 03/29/2017 0949   TRIG 112 03/29/2017 0949   HDL 43 03/29/2017 0949   CHOLHDL 4.3 03/29/2017 0949   LDLCALC 118 (H) 03/29/2017 0949    CBC    Component Value  Date/Time   WBC 6.5 03/29/2017 0949   RBC 4.90 03/29/2017 0949   HGB 14.7 03/29/2017 0949   HCT 42.8 03/29/2017 0949   PLT 314 03/29/2017 0949   MCV 87.3 03/29/2017 0949   MCH 30.0 03/29/2017 0949   MCHC 34.3 03/29/2017 0949   RDW 12.8 03/29/2017 0949   LYMPHSABS 2,139 03/29/2017 0949   EOSABS 163 03/29/2017 0949   BASOSABS 39 03/29/2017 0949    Hgb A1C Lab Results  Component Value Date   HGBA1C 5.5 03/29/2017            Assessment & Plan:   Chronic neck pain, left shoulder pain:  Cervical x-ray reviewed  He declines physical therapy at this time Will pain x-ray left shoulder Rx for Pred taper for symptom management Discontinue methocarbamol Rx for cyclobenzaprine 10 mg 3 times daily as needed-days call to given Shoulder exercise given Consider referral to orthopedics versus PT once shoulder images are available  Elevated blood pressure reading in office without diagnosis of HTN:  Will monitor for now Will reevaluate at annual exam and if remains elevated will consider antihypertensive therapy Schedule an appointment for your annual exam Nicki Reaper, NP

## 2022-10-22 ENCOUNTER — Ambulatory Visit
Admission: RE | Admit: 2022-10-22 | Discharge: 2022-10-22 | Disposition: A | Discharge status: Home / Self Care | Payer: Managed Care, Other (non HMO) | Attending: Internal Medicine | Admitting: Internal Medicine

## 2022-10-22 ENCOUNTER — Ambulatory Visit
Admission: RE | Admit: 2022-10-22 | Discharge: 2022-10-22 | Disposition: A | Discharge status: Home / Self Care | Payer: Managed Care, Other (non HMO) | Source: Ambulatory Visit | Attending: Internal Medicine | Admitting: Internal Medicine

## 2022-10-22 DIAGNOSIS — M25512 Pain in left shoulder: Secondary | ICD-10-CM | POA: Diagnosis present

## 2022-10-22 DIAGNOSIS — G8929 Other chronic pain: Secondary | ICD-10-CM | POA: Insufficient documentation

## 2022-11-05 ENCOUNTER — Ambulatory Visit: Payer: Managed Care, Other (non HMO) | Admitting: Internal Medicine

## 2022-11-20 ENCOUNTER — Ambulatory Visit (INDEPENDENT_AMBULATORY_CARE_PROVIDER_SITE_OTHER): Payer: Managed Care, Other (non HMO) | Admitting: Internal Medicine

## 2022-11-20 ENCOUNTER — Encounter: Payer: Self-pay | Admitting: Internal Medicine

## 2022-11-20 VITALS — BP 155/92 | HR 79 | Resp 18 | Wt 248.2 lb

## 2022-11-20 DIAGNOSIS — Z0001 Encounter for general adult medical examination with abnormal findings: Secondary | ICD-10-CM | POA: Diagnosis not present

## 2022-11-20 DIAGNOSIS — Z125 Encounter for screening for malignant neoplasm of prostate: Secondary | ICD-10-CM | POA: Diagnosis not present

## 2022-11-20 DIAGNOSIS — R739 Hyperglycemia, unspecified: Secondary | ICD-10-CM | POA: Diagnosis not present

## 2022-11-20 DIAGNOSIS — E66812 Obesity, class 2: Secondary | ICD-10-CM

## 2022-11-20 DIAGNOSIS — Z6835 Body mass index (BMI) 35.0-35.9, adult: Secondary | ICD-10-CM

## 2022-11-20 DIAGNOSIS — E7800 Pure hypercholesterolemia, unspecified: Secondary | ICD-10-CM | POA: Diagnosis not present

## 2022-11-20 MED ORDER — TAMSULOSIN HCL 0.4 MG PO CAPS: 0.4000 mg | ORAL_CAPSULE | Freq: Every day | ORAL | 1 refills | Status: AC

## 2022-11-20 MED ORDER — OMEPRAZOLE 40 MG PO CPDR: 40.0000 mg | DELAYED_RELEASE_CAPSULE | Freq: Every day | ORAL | 1 refills | Status: DC

## 2022-11-20 MED ORDER — VALSARTAN 80 MG PO TABS: 80.0000 mg | ORAL_TABLET | Freq: Every day | ORAL | 1 refills | Status: AC

## 2022-11-20 NOTE — Patient Instructions (Signed)
Health Maintenance, Male Adopting a healthy lifestyle and getting preventive care are important in promoting health and wellness. Ask your health care provider about: The right schedule for you to have regular tests and exams. Things you can do on your own to prevent diseases and keep yourself healthy. What should I know about diet, weight, and exercise? Eat a healthy diet  Eat a diet that includes plenty of vegetables, fruits, low-fat dairy products, and lean protein. Do not eat a lot of foods that are high in solid fats, added sugars, or sodium. Maintain a healthy weight Body mass index (BMI) is a measurement that can be used to identify possible weight problems. It estimates body fat based on height and weight. Your health care provider can help determine your BMI and help you achieve or maintain a healthy weight. Get regular exercise Get regular exercise. This is one of the most important things you can do for your health. Most adults should: Exercise for at least 150 minutes each week. The exercise should increase your heart rate and make you sweat (moderate-intensity exercise). Do strengthening exercises at least twice a week. This is in addition to the moderate-intensity exercise. Spend less time sitting. Even light physical activity can be beneficial. Watch cholesterol and blood lipids Have your blood tested for lipids and cholesterol at 48 years of age, then have this test every 5 years. You may need to have your cholesterol levels checked more often if: Your lipid or cholesterol levels are high. You are older than 48 years of age. You are at high risk for heart disease. What should I know about cancer screening? Many types of cancers can be detected early and may often be prevented. Depending on your health history and family history, you may need to have cancer screening at various ages. This may include screening for: Colorectal cancer. Prostate cancer. Skin cancer. Lung  cancer. What should I know about heart disease, diabetes, and high blood pressure? Blood pressure and heart disease High blood pressure causes heart disease and increases the risk of stroke. This is more likely to develop in people who have high blood pressure readings or are overweight. Talk with your health care provider about your target blood pressure readings. Have your blood pressure checked: Every 3-5 years if you are 18-39 years of age. Every year if you are 40 years old or older. If you are between the ages of 65 and 75 and are a current or former smoker, ask your health care provider if you should have a one-time screening for abdominal aortic aneurysm (AAA). Diabetes Have regular diabetes screenings. This checks your fasting blood sugar level. Have the screening done: Once every three years after age 45 if you are at a normal weight and have a low risk for diabetes. More often and at a younger age if you are overweight or have a high risk for diabetes. What should I know about preventing infection? Hepatitis B If you have a higher risk for hepatitis B, you should be screened for this virus. Talk with your health care provider to find out if you are at risk for hepatitis B infection. Hepatitis C Blood testing is recommended for: Everyone born from 1945 through 1965. Anyone with known risk factors for hepatitis C. Sexually transmitted infections (STIs) You should be screened each year for STIs, including gonorrhea and chlamydia, if: You are sexually active and are younger than 48 years of age. You are older than 48 years of age and your   health care provider tells you that you are at risk for this type of infection. Your sexual activity has changed since you were last screened, and you are at increased risk for chlamydia or gonorrhea. Ask your health care provider if you are at risk. Ask your health care provider about whether you are at high risk for HIV. Your health care provider  may recommend a prescription medicine to help prevent HIV infection. If you choose to take medicine to prevent HIV, you should first get tested for HIV. You should then be tested every 3 months for as long as you are taking the medicine. Follow these instructions at home: Alcohol use Do not drink alcohol if your health care provider tells you not to drink. If you drink alcohol: Limit how much you have to 0-2 drinks a day. Know how much alcohol is in your drink. In the [place] one drink equals one 12 oz bottle of beer (355 mL), one 5 oz glass of wine (148 mL), or one 1 oz glass of hard liquor (44 mL). Lifestyle Do not use any products that contain nicotine or tobacco. These products include cigarettes, chewing tobacco, and vaping devices, such as e-cigarettes. If you need help quitting, ask your health care provider. Do not use street drugs. Do not share needles. Ask your health care provider for help if you need support or information about quitting drugs. General instructions Schedule regular health, dental, and eye exams. Stay current with your vaccines. Tell your health care provider if: You often feel depressed. You have ever been abused or do not feel safe at home. Summary Adopting a healthy lifestyle and getting preventive care are important in promoting health and wellness. Follow your health care provider's instructions about healthy diet, exercising, and getting tested or screened for diseases. Follow your health care provider's instructions on monitoring your cholesterol and blood pressure. This information is not intended to replace advice given to you by your health care provider. Make sure you discuss any questions you have with your health care provider. Document Revised: 06/24/2020 Document Reviewed: 06/24/2020 [employer] Patient Education  2024 [employer]  

## 2022-11-20 NOTE — Progress Notes (Signed)
Subjective:    Patient ID: Tyler Hinton, male    DOB: [birth year], 47 y.o.   MRN: [id number]  HPI  Patient presents to clinic today for his annual exam.  Flu: never Tetanus: > 10 year ago COVID: x 2 Colon screening: Cologuard 02/2022 Vision screening: as needed Dentist: biannually  Diet: He does eat meat. He consumes fruits and veggies. He does eat fried foods. He drinks mostly coffee, flavored water Exercise: Gym 4 x week  Review of Systems     Past Medical History:  Diagnosis Date   GERD (gastroesophageal reflux disease)    Headache    Hypertension    Plantar fasciitis    in past    Current Outpatient Medications  Medication Sig Dispense Refill   cyclobenzaprine (FLEXERIL) 10 MG tablet Take 1 tablet (10 mg total) by mouth 3 (three) times daily as needed for muscle spasms. 30 tablet 0   ibuprofen (ADVIL,MOTRIN) 200 MG tablet Take 200 mg by mouth every 6 (six) hours as needed.     omeprazole (PRILOSEC) 20 MG capsule TAKE 1 CAPSULE BY MOUTH EVERY DAY 30 capsule 0   predniSONE (DELTASONE) 10 MG tablet Take 3 tabs on days 1-3, 2 tabs on days 4-6, 1 tab on days 7-9 18 tablet 0   tamsulosin (FLOMAX) 0.4 MG CAPS capsule Take 1 capsule (0.4 mg total) by mouth daily. 90 capsule 1   valsartan (DIOVAN) 80 MG tablet Take 1 tablet (80 mg total) by mouth daily. (Patient not taking: Reported on 10/16/2022) 90 tablet 1   No current facility-administered medications for this visit.    Allergies  Allergen Reactions   Aspirin Hives   Lisinopril Cough    Family History  Problem Relation Age of Onset   Cancer Maternal Grandmother    Diabetes Maternal Grandmother    Cancer Maternal Grandfather    Diabetes Maternal Grandfather    Colon cancer Maternal Aunt     Social History   Socioeconomic History   Marital status: Married    Spouse name: Not on file   Number of children: Not on file   Years of education: Not on file   Highest education level: Not on file  Occupational  History   Not on file  Tobacco Use   Smoking status: Never   Smokeless tobacco: Never  Vaping Use   Vaping status: Never Used  Substance and Sexual Activity   Alcohol use: No    Alcohol/week: 0.0 standard drinks of alcohol    Comment: former drinker   Drug use: No   Sexual activity: Not on file  Other Topics Concern   Not on file  Social History Narrative   Not on file   Social Determinants of Health   Financial Resource Strain: Not on file  Food Insecurity: Not on file  Transportation Needs: Not on file  Physical Activity: Not on file  Stress: Not on file  Social Connections: Not on file  Intimate Partner Violence: Not on file     Constitutional: Patient reports intermittent headaches.  Denies fever, malaise, fatigue, or abrupt weight changes.  HEENT: Denies eye pain, eye redness, ear pain, ringing in the ears, wax buildup, runny nose, nasal congestion, bloody nose, or sore throat. Respiratory: Denies difficulty breathing, shortness of breath, cough or sputum production.   Cardiovascular: Denies chest pain, chest tightness, palpitations or swelling in the hands or feet.  Gastrointestinal: Patient reports intermittent nausea, abdominal cramping, vomiting and diarrhea.  Denies bloating, constipation, or blood in  the stool.  GU: Patient reports nocturia.  Denies urgency, frequency, pain with urination, burning sensation, blood in urine, odor or discharge. Musculoskeletal: Patient reports persistent left shoulder and neck pain.  Denies decrease in range of motion, difficulty with gait, or joint swelling.  Skin: Denies redness, rashes, lesions or ulcercations.  Neurological: Denies dizziness, difficulty with memory, difficulty with speech or problems with balance and coordination.  Psych: Denies anxiety, depression, SI/HI.  No other specific complaints in a complete review of systems (except as listed in HPI above).  Objective:   Physical Exam BP (!) 155/92   Pulse 79    Resp 18   Wt 248 lb 3.2 oz (112.6 kg)   SpO2 98%   BMI 35.61 kg/m   Wt Readings from Last 3 Encounters:  10/16/22 253 lb (114.8 kg)  04/09/22 255 lb (115.7 kg)  01/22/22 259 lb (117.5 kg)    General: Appears his stated age, obese, in NAD. Skin: Warm, dry and intact.  HEENT: Head: normal shape and size; Eyes: sclera white, no icterus, conjunctiva pink, PERRLA and EOMs intact;  Neck:  Neck supple, trachea midline. No masses, lumps or thyromegaly present.  Cardiovascular: Normal rate and rhythm. S1,S2 noted.  No murmur, rubs or gallops noted. No JVD or BLE edema. No carotid bruits noted. Pulmonary/Chest: Normal effort and positive vesicular breath sounds. No respiratory distress. No wheezes, rales or ronchi noted.  Abdomen: Soft and nontender. Normal bowel sounds.  Musculoskeletal: Strength 5/5 BUE/BLE.  No difficulty with gait.  Neurological: Alert and oriented. Cranial nerves II-XII grossly intact. Coordination normal.  Psychiatric: Mood and affect normal. Behavior is normal. Judgment and thought content normal.    BMET    Component Value Date/Time   NA 142 03/29/2017 0949   K 4.5 03/29/2017 0949   CL 107 03/29/2017 0949   CO2 28 03/29/2017 0949   GLUCOSE 89 03/29/2017 0949   BUN 10 03/29/2017 0949   CREATININE 0.87 03/29/2017 0949   CALCIUM 9.5 03/29/2017 0949    Lipid Panel     Component Value Date/Time   CHOL 183 03/29/2017 0949   TRIG 112 03/29/2017 0949   HDL 43 03/29/2017 0949   CHOLHDL 4.3 03/29/2017 0949   LDLCALC 118 (H) 03/29/2017 0949    CBC    Component Value Date/Time   WBC 6.5 03/29/2017 0949   RBC 4.90 03/29/2017 0949   HGB 14.7 03/29/2017 0949   HCT 42.8 03/29/2017 0949   PLT 314 03/29/2017 0949   MCV 87.3 03/29/2017 0949   MCH 30.0 03/29/2017 0949   MCHC 34.3 03/29/2017 0949   RDW 12.8 03/29/2017 0949   LYMPHSABS 2,139 03/29/2017 0949   EOSABS 163 03/29/2017 0949   BASOSABS 39 03/29/2017 0949    Hgb A1C Lab Results  Component Value  Date   HGBA1C 5.5 03/29/2017            Assessment & Plan:   Preventative health maintenance:  Flu shot declined Tetanus declined Encouraged him to get his COVID-vaccine Colon screening UTD Encouraged him to consume a balanced diet and exercise regimen Advised him to see an eye doctor and dentist annually We will check CBC, c-Met, lipid, A1c and PSA today  RTC in 6 months, follow-up chronic conditions Nicki Reaper, NP

## 2022-11-20 NOTE — Assessment & Plan Note (Signed)
Encouraged diet and exercise for weight loss ?

## 2023-05-21 ENCOUNTER — Ambulatory Visit: Payer: Self-pay | Admitting: Internal Medicine

## 2023-05-21 ENCOUNTER — Other Ambulatory Visit: Payer: Self-pay | Admitting: Internal Medicine

## 2023-05-21 DIAGNOSIS — Z0001 Encounter for general adult medical examination with abnormal findings: Secondary | ICD-10-CM

## 2023-05-24 NOTE — Telephone Encounter (Signed)
 Requested medication (s) are due for refill today: yes   Requested medication (s) are on the active medication list: yes   Last refill:  11/20/22 #90 1 refills  Future visit scheduled: no   Notes to clinic:  protocol failed. Last BP 155/92 on 11/20/22 and no PSA level recorded. Do you want to refill Rx?     Requested Prescriptions  Pending Prescriptions Disp Refills   tamsulosin (FLOMAX) 0.4 MG CAPS capsule [Pharmacy Med Name: TAMSULOSIN HCL 0.4 MG CAPSULE] 90 capsule 1    Sig: TAKE 1 CAPSULE BY MOUTH EVERY DAY     Urology: Alpha-Adrenergic Blocker Failed - 05/24/2023 11:12 AM      Failed - PSA in normal range and within 360 days    No results found for: "LABPSA", "PSA", "PSA1", "ULTRAPSA"       Failed - Last BP in normal range    BP Readings from Last 1 Encounters:  11/20/22 (!) 155/92         Failed - Valid encounter within last 12 months    Recent Outpatient Visits   None

## 2023-06-06 ENCOUNTER — Other Ambulatory Visit: Payer: Self-pay | Admitting: Internal Medicine

## 2023-06-07 NOTE — Telephone Encounter (Signed)
 Requested Prescriptions  Pending Prescriptions Disp Refills   omeprazole  (PRILOSEC) 40 MG capsule [Pharmacy Med Name: OMEPRAZOLE  DR 40 MG CAPSULE] 90 capsule 1    Sig: TAKE 1 CAPSULE (40 MG TOTAL) BY MOUTH DAILY.     Gastroenterology: Proton Pump Inhibitors Failed - 06/07/2023  1:41 PM      Failed - Valid encounter within last 12 months    Recent Outpatient Visits   None

## 2024-03-14 ENCOUNTER — Telehealth: Payer: Self-pay

## 2024-03-14 NOTE — Transitions of Care (Post Inpatient/ED Visit) (Signed)
" ° °  03/14/2024  Name: Tyler Hinton MRN: [id number] DOB: 05-14-1975  Today's TOC FU Call Status: Today's TOC FU Call Status:: Successful TOC FU Call Completed TOC FU Call Complete Date: 03/14/24  Patient's Name and Date of Birth confirmed. Name, DOB  Transition Care Management Follow-up Telephone Call Date of Discharge: 03/13/24 Discharge Facility: Other [facility]) Name of Other (Non[facility]) Discharge Facility: [facility] Type of Discharge: Inpatient Admission Primary Inpatient Discharge Diagnosis:: RUQ pain How have you been since you were released from the hospital?: Better Any questions or concerns?: No  Items Reviewed: Did you receive and understand the discharge instructions provided?: Yes Medications obtained,verified, and reconciled?: Yes (Medications Reviewed) Any new allergies since your discharge?: No Dietary orders reviewed?: Yes Do you have support at home?: Yes People in Home [RPT]: spouse  Medications Reviewed Today: Medications Reviewed Today     Reviewed by Emmitt Pan, LPN (Licensed Practical Nurse) on 03/14/24 at [phone]  Med List Status: <None>   Medication Order Taking? Sig Documenting Provider Last Dose Status Informant  acetaminophen  (TYLENOL ) 325 MG tablet [id number] Yes Take 650 mg by mouth. [provider]  Active   cyclobenzaprine  (FLEXERIL ) 10 MG tablet [id number] Yes Take 1 tablet (10 mg total) by mouth 3 (three) times daily as needed for muscle spasms. Antonette Angeline ORN, NP  Active   ibuprofen  (ADVIL ,MOTRIN ) 200 MG tablet [id number] Yes Take 200 mg by mouth every 6 (six) hours as needed. [provider]  Active   omeprazole  (PRILOSEC) 40 MG capsule [id number] Yes TAKE 1 CAPSULE (40 MG TOTAL) BY MOUTH DAILY. Antonette Angeline ORN, NP  Active   tamsulosin  (FLOMAX ) 0.4 MG CAPS capsule [id number] Yes Take 1 capsule (0.4 mg total) by mouth daily. Antonette Angeline ORN, NP  Active   valsartan  (DIOVAN ) 80 MG tablet [id number] Yes Take 1 tablet (80 mg total) by  mouth daily. Antonette Angeline ORN, NP  Active             Home Care and Equipment/Supplies: Were Home Health Services Ordered?: NA Any new equipment or medical supplies ordered?: NA  Functional Questionnaire: Do you need assistance with bathing/showering or dressing?: No Do you need assistance with meal preparation?: No Do you need assistance with eating?: No Do you have difficulty maintaining continence: No Do you need assistance with getting out of bed/getting out of a chair/moving?: No Do you have difficulty managing or taking your medications?: No  Follow up appointments reviewed: PCP Follow-up appointment confirmed?: No (declined, will call back to schedule) MD Provider Line [phone] Given: No Specialist Hospital Follow-up appointment confirmed?: NA Do you need transportation to your follow-up appointment?: No Do you understand care options if your condition(s) worsen?: Yes-patient verbalized understanding    SIGNATURE Pan Emmitt, LPN [facility] Nurse Health Advisor Direct Dial [phone]  "
